# Patient Record
Sex: Female | Born: 1935 | Race: White | Hispanic: No | Smoking: Never smoker
Health system: Southern US, Community
[De-identification: ages and names within clinical notes are randomized; demographics above are authoritative.]

## PROBLEM LIST (undated history)

## (undated) DIAGNOSIS — E559 Vitamin D deficiency, unspecified: Secondary | ICD-10-CM

## (undated) DIAGNOSIS — I10 Essential (primary) hypertension: Secondary | ICD-10-CM

## (undated) DIAGNOSIS — E785 Hyperlipidemia, unspecified: Secondary | ICD-10-CM

## (undated) HISTORY — PX: OTHER SURGICAL HISTORY: SHX169

---

## 2017-03-08 ENCOUNTER — Ambulatory Visit: Payer: Medicare Other | Admitting: Physical Therapy

## 2017-03-12 ENCOUNTER — Encounter: Payer: Medicare Other | Admitting: Physical Therapy

## 2017-03-14 ENCOUNTER — Encounter: Payer: Medicare Other | Admitting: Physical Therapy

## 2017-03-19 ENCOUNTER — Ambulatory Visit: Payer: Medicare Other | Admitting: Physical Therapy

## 2017-03-21 ENCOUNTER — Encounter: Payer: Medicare Other | Admitting: Physical Therapy

## 2017-03-26 ENCOUNTER — Encounter: Payer: Medicare Other | Admitting: Physical Therapy

## 2017-03-28 ENCOUNTER — Encounter: Payer: Medicare Other | Admitting: Physical Therapy

## 2017-04-02 ENCOUNTER — Encounter: Payer: Medicare Other | Admitting: Physical Therapy

## 2018-03-03 ENCOUNTER — Inpatient Hospital Stay: Payer: Medicare Other

## 2018-03-03 ENCOUNTER — Other Ambulatory Visit: Payer: Self-pay

## 2018-03-03 ENCOUNTER — Encounter: Payer: Self-pay | Admitting: Internal Medicine

## 2018-03-03 ENCOUNTER — Emergency Department: Payer: Medicare Other

## 2018-03-03 ENCOUNTER — Inpatient Hospital Stay
Admission: EM | Admit: 2018-03-03 | Discharge: 2018-03-06 | DRG: 065 | Disposition: A | Discharge status: Skilled Nursing Facility | Payer: Medicare Other | Attending: Internal Medicine | Admitting: Internal Medicine

## 2018-03-03 DIAGNOSIS — Z515 Encounter for palliative care: Secondary | ICD-10-CM | POA: Diagnosis not present

## 2018-03-03 DIAGNOSIS — E875 Hyperkalemia: Secondary | ICD-10-CM | POA: Diagnosis not present

## 2018-03-03 DIAGNOSIS — G8194 Hemiplegia, unspecified affecting left nondominant side: Secondary | ICD-10-CM | POA: Diagnosis present

## 2018-03-03 DIAGNOSIS — E7800 Pure hypercholesterolemia, unspecified: Secondary | ICD-10-CM | POA: Diagnosis present

## 2018-03-03 DIAGNOSIS — R4781 Slurred speech: Secondary | ICD-10-CM | POA: Diagnosis present

## 2018-03-03 DIAGNOSIS — Y92009 Unspecified place in unspecified non-institutional (private) residence as the place of occurrence of the external cause: Secondary | ICD-10-CM

## 2018-03-03 DIAGNOSIS — I639 Cerebral infarction, unspecified: Principal | ICD-10-CM | POA: Diagnosis present

## 2018-03-03 DIAGNOSIS — R531 Weakness: Secondary | ICD-10-CM

## 2018-03-03 DIAGNOSIS — N390 Urinary tract infection, site not specified: Secondary | ICD-10-CM

## 2018-03-03 DIAGNOSIS — Z66 Do not resuscitate: Secondary | ICD-10-CM | POA: Diagnosis present

## 2018-03-03 DIAGNOSIS — W19XXXA Unspecified fall, initial encounter: Secondary | ICD-10-CM | POA: Diagnosis present

## 2018-03-03 DIAGNOSIS — E860 Dehydration: Secondary | ICD-10-CM | POA: Diagnosis present

## 2018-03-03 DIAGNOSIS — R1310 Dysphagia, unspecified: Secondary | ICD-10-CM | POA: Diagnosis present

## 2018-03-03 DIAGNOSIS — F419 Anxiety disorder, unspecified: Secondary | ICD-10-CM | POA: Diagnosis present

## 2018-03-03 DIAGNOSIS — E119 Type 2 diabetes mellitus without complications: Secondary | ICD-10-CM | POA: Diagnosis present

## 2018-03-03 DIAGNOSIS — E785 Hyperlipidemia, unspecified: Secondary | ICD-10-CM | POA: Diagnosis present

## 2018-03-03 DIAGNOSIS — R29713 NIHSS score 13: Secondary | ICD-10-CM | POA: Diagnosis present

## 2018-03-03 DIAGNOSIS — E559 Vitamin D deficiency, unspecified: Secondary | ICD-10-CM | POA: Diagnosis present

## 2018-03-03 DIAGNOSIS — Z7189 Other specified counseling: Secondary | ICD-10-CM | POA: Diagnosis not present

## 2018-03-03 DIAGNOSIS — E876 Hypokalemia: Secondary | ICD-10-CM | POA: Diagnosis present

## 2018-03-03 DIAGNOSIS — Z7982 Long term (current) use of aspirin: Secondary | ICD-10-CM | POA: Diagnosis not present

## 2018-03-03 DIAGNOSIS — I10 Essential (primary) hypertension: Secondary | ICD-10-CM | POA: Diagnosis present

## 2018-03-03 DIAGNOSIS — R262 Difficulty in walking, not elsewhere classified: Secondary | ICD-10-CM

## 2018-03-03 HISTORY — DX: Vitamin D deficiency, unspecified: E55.9

## 2018-03-03 HISTORY — DX: Essential (primary) hypertension: I10

## 2018-03-03 HISTORY — DX: Hyperlipidemia, unspecified: E78.5

## 2018-03-03 LAB — BASIC METABOLIC PANEL
ANION GAP: 17 — AB (ref 5–15)
BUN: 38 mg/dL — ABNORMAL HIGH (ref 8–23)
CALCIUM: 9.7 mg/dL (ref 8.9–10.3)
CO2: 27 mmol/L (ref 22–32)
CREATININE: 0.97 mg/dL (ref 0.44–1.00)
Chloride: 93 mmol/L — ABNORMAL LOW (ref 98–111)
GFR calc Af Amer: >60 mL/min (ref >60)
GFR calc non Af Amer: 53 mL/min — ABNORMAL LOW (ref >60)
GLUCOSE: 119 mg/dL — AB (ref 70–99)
Potassium: 2.3 mmol/L — CL (ref 3.5–5.1)
Sodium: 137 mmol/L (ref 135–145)

## 2018-03-03 LAB — CK: Total CK: 31 U/L — ABNORMAL LOW (ref 38–234)

## 2018-03-03 LAB — HEPATIC FUNCTION PANEL
ALBUMIN: 3 g/dL — AB (ref 3.5–5.0)
ALT: 22 U/L (ref 0–44)
AST: 22 U/L (ref 15–41)
Alkaline Phosphatase: 85 U/L (ref 38–126)
BILIRUBIN DIRECT: 0.2 mg/dL (ref 0.0–0.2)
BILIRUBIN TOTAL: 1.1 mg/dL (ref 0.3–1.2)
Indirect Bilirubin: 0.9 mg/dL (ref 0.3–0.9)
Total Protein: 6.9 g/dL (ref 6.5–8.1)

## 2018-03-03 LAB — URINALYSIS, COMPLETE (UACMP) WITH MICROSCOPIC
Bilirubin Urine: NEGATIVE
GLUCOSE, UA: NEGATIVE mg/dL
HGB URINE DIPSTICK: NEGATIVE
Ketones, ur: 20 mg/dL — AB
Nitrite: NEGATIVE
PH: 6 (ref 5.0–8.0)
Protein, ur: NEGATIVE mg/dL
SPECIFIC GRAVITY, URINE: 1.02 (ref 1.005–1.030)

## 2018-03-03 LAB — CBC
HCT: 33.5 % — ABNORMAL LOW (ref 35.0–47.0)
Hemoglobin: 11.6 g/dL — ABNORMAL LOW (ref 12.0–16.0)
MCH: 31.9 pg (ref 26.0–34.0)
MCHC: 34.6 g/dL (ref 32.0–36.0)
MCV: 92.3 fL (ref 80.0–100.0)
Platelets: 285 10*3/uL (ref 150–440)
RBC: 3.63 MIL/uL — AB (ref 3.80–5.20)
RDW: 14.7 % — ABNORMAL HIGH (ref 11.5–14.5)
WBC: 8.7 10*3/uL (ref 3.6–11.0)

## 2018-03-03 LAB — MAGNESIUM: Magnesium: 2.2 mg/dL (ref 1.7–2.4)

## 2018-03-03 LAB — LIPASE, BLOOD: Lipase: 57 U/L — ABNORMAL HIGH (ref 11–51)

## 2018-03-03 LAB — TROPONIN I: TROPONIN I: 0.04 ng/mL — AB (ref <0.03)

## 2018-03-03 MED ORDER — SODIUM CHLORIDE 0.9 % IV BOLUS
500.0000 mL | Freq: Once | INTRAVENOUS | Status: AC
  Administered 2018-03-03: 500 mL via INTRAVENOUS

## 2018-03-03 MED ORDER — POTASSIUM CHLORIDE 10 MEQ/100ML IV SOLN
10.0000 meq | Freq: Once | INTRAVENOUS | Status: AC
  Administered 2018-03-03: 10 meq via INTRAVENOUS
  Filled 2018-03-03: qty 100

## 2018-03-03 MED ORDER — POTASSIUM CHLORIDE IN NACL 20-0.9 MEQ/L-% IV SOLN
INTRAVENOUS | Status: DC
  Administered 2018-03-03 – 2018-03-04 (×2): via INTRAVENOUS
  Filled 2018-03-03 (×6): qty 1000

## 2018-03-03 MED ORDER — SODIUM CHLORIDE 0.9 % IV SOLN
1.0000 g | Freq: Once | INTRAVENOUS | Status: AC
  Administered 2018-03-03: 1 g via INTRAVENOUS
  Filled 2018-03-03: qty 10

## 2018-03-03 MED ORDER — ATORVASTATIN CALCIUM 10 MG PO TABS
10.0000 mg | ORAL_TABLET | Freq: Every evening | ORAL | Status: DC
  Administered 2018-03-03: 10 mg via ORAL
  Filled 2018-03-03: qty 1

## 2018-03-03 MED ORDER — ONDANSETRON HCL 4 MG/2ML IJ SOLN: 4.0000 mg | Freq: Four times a day (QID) | INTRAMUSCULAR | Status: DC | PRN

## 2018-03-03 MED ORDER — AMLODIPINE BESYLATE 5 MG PO TABS
10.0000 mg | ORAL_TABLET | Freq: Every day | ORAL | Status: DC
Start: 2018-03-03 — End: 2018-03-04
  Administered 2018-03-03: 10 mg via ORAL
  Filled 2018-03-03 (×2): qty 2

## 2018-03-03 MED ORDER — SODIUM CHLORIDE 0.9 % IV SOLN
1.0000 g | INTRAVENOUS | Status: DC
  Administered 2018-03-04: 1 g via INTRAVENOUS
  Filled 2018-03-03: qty 1

## 2018-03-03 MED ORDER — PREGABALIN 50 MG PO CAPS
50.0000 mg | ORAL_CAPSULE | Freq: Three times a day (TID) | ORAL | Status: DC
  Administered 2018-03-03 – 2018-03-06 (×7): 50 mg via ORAL
  Filled 2018-03-03 (×8): qty 1

## 2018-03-03 MED ORDER — POTASSIUM CHLORIDE CRYS ER 20 MEQ PO TBCR
40.0000 meq | EXTENDED_RELEASE_TABLET | Freq: Once | ORAL | Status: AC
  Administered 2018-03-03: 40 meq via ORAL
  Filled 2018-03-03: qty 2

## 2018-03-03 MED ORDER — METOPROLOL TARTRATE 50 MG PO TABS
100.0000 mg | ORAL_TABLET | Freq: Two times a day (BID) | ORAL | Status: DC
Start: 2018-03-03 — End: 2018-03-04
  Filled 2018-03-03: qty 2

## 2018-03-03 MED ORDER — ACETAMINOPHEN 325 MG PO TABS
650.0000 mg | ORAL_TABLET | Freq: Four times a day (QID) | ORAL | Status: DC | PRN
  Administered 2018-03-03: 650 mg via ORAL
  Filled 2018-03-03: qty 2

## 2018-03-03 MED ORDER — CHOLECALCIFEROL 10 MCG (400 UNIT) PO TABS
400.0000 [IU] | ORAL_TABLET | Freq: Two times a day (BID) | ORAL | Status: DC
Start: 2018-03-03 — End: 2018-03-05
  Administered 2018-03-03 – 2018-03-05 (×4): 400 [IU] via ORAL
  Filled 2018-03-03 (×5): qty 1

## 2018-03-03 MED ORDER — HYDROCODONE-ACETAMINOPHEN 5-325 MG PO TABS
1.0000 | ORAL_TABLET | ORAL | Status: DC | PRN
  Administered 2018-03-04: 1 via ORAL
  Administered 2018-03-05 – 2018-03-06 (×3): 2 via ORAL
  Filled 2018-03-03: qty 2
  Filled 2018-03-03 (×2): qty 1
  Filled 2018-03-03: qty 2
  Filled 2018-03-03: qty 1

## 2018-03-03 MED ORDER — CAPTOPRIL 25 MG PO TABS
25.0000 mg | ORAL_TABLET | Freq: Two times a day (BID) | ORAL | Status: DC
  Administered 2018-03-03 – 2018-03-06 (×6): 25 mg via ORAL
  Filled 2018-03-03 (×7): qty 1

## 2018-03-03 MED ORDER — ACETAMINOPHEN 650 MG RE SUPP: 650.0000 mg | Freq: Four times a day (QID) | RECTAL | Status: DC | PRN

## 2018-03-03 MED ORDER — ASPIRIN EC 81 MG PO TBEC
81.0000 mg | DELAYED_RELEASE_TABLET | Freq: Every day | ORAL | Status: DC
  Administered 2018-03-04: 81 mg via ORAL
  Filled 2018-03-03 (×2): qty 1

## 2018-03-03 MED ORDER — SENNOSIDES-DOCUSATE SODIUM 8.6-50 MG PO TABS: 1.0000 | ORAL_TABLET | Freq: Every evening | ORAL | Status: DC | PRN

## 2018-03-03 MED ORDER — ONDANSETRON HCL 4 MG PO TABS: 4.0000 mg | ORAL_TABLET | Freq: Four times a day (QID) | ORAL | Status: DC | PRN

## 2018-03-03 MED ORDER — ENOXAPARIN SODIUM 40 MG/0.4ML ~~LOC~~ SOLN
40.0000 mg | SUBCUTANEOUS | Status: DC
  Administered 2018-03-03 – 2018-03-04 (×2): 40 mg via SUBCUTANEOUS
  Filled 2018-03-03 (×2): qty 0.4

## 2018-03-03 NOTE — H&P (Signed)
[facility] - [place] at [facility]   PATIENT NAME: [name]    MR#:  [id number]  DATE OF BIRTH:  April 09, 1933  DATE OF ADMISSION:  03/03/2018  PRIMARY CARE PHYSICIAN: Katrinka Blazing, MD   REQUESTING/REFERRING PHYSICIAN:   CHIEF COMPLAINT:   Chief Complaint  Patient presents with  . Weakness    HISTORY OF PRESENT ILLNESS: [name]  is a 84 y.o. female with a known history of hypertension, hyperlipidemia, vitamin D deficiency presented to the emergency room with generalized weakness.  Patient unable to ambulate for the last 2 days because of weakness.  According to the family member patient usually moves around at home with the help of a walker.  She has not been eating well and drinking enough fluids for the last couple of days.  Patient appears dry and dehydrated.  Work-up in the emergency room showed low potassium level and the patient was supplemented orally as well as intravenously.  She also has some dysuria.  No complaints of any chest pain, shortness of breath.  No history of any falls, head injury.  PAST MEDICAL HISTORY:   Past Medical History:  Diagnosis Date  . Hyperlipidemia   . Hypertension   . Vitamin D deficiency     PAST SURGICAL HISTORY: None  SOCIAL HISTORY:  Social History   Tobacco Use  . Smoking status: Never Smoker  . Smokeless tobacco: Never Used  Substance Use Topics  . Alcohol use: Never    Frequency: Never    FAMILY HISTORY: Mother and father deceased Reviewed and non contributory  DRUG ALLERGIES: No Known Allergies  REVIEW OF SYSTEMS:   CONSTITUTIONAL: No fever, has fatigue and weakness.  EYES: No blurred or double vision.  EARS, NOSE, AND THROAT: No tinnitus or ear pain.  RESPIRATORY: No cough, shortness of breath, wheezing or hemoptysis.  CARDIOVASCULAR: No chest pain, orthopnea, edema.  GASTROINTESTINAL: No nausea, vomiting, diarrhea or abdominal pain.  GENITOURINARY: No dysuria, hematuria.   ENDOCRINE: No polyuria, nocturia,  HEMATOLOGY: No anemia, easy bruising or bleeding SKIN: No rash or lesion. MUSCULOSKELETAL: No joint pain or arthritis.   Has low back pain. NEUROLOGIC: No tingling, numbness, weakness.  PSYCHIATRY: No anxiety or depression.   MEDICATIONS AT HOME:  Prior to Admission medications   Medication Sig Start Date End Date Taking? Authorizing Provider  amLODipine (NORVASC) 10 MG tablet Take 1 tablet by mouth daily. 05/03/17  Yes [provider]  aspirin (ASPIR-LOW) 81 MG EC tablet Take 1 tablet by mouth daily. 09/15/10  Yes [provider]  atorvastatin (LIPITOR) 10 MG tablet Take 1 tablet by mouth daily. 05/03/17  Yes [provider]  captopril (CAPOTEN) 25 MG tablet Take 1 tablet by mouth 2 (two) times daily. 05/03/17  Yes [provider]  chlorthalidone (HYGROTON) 25 MG tablet Take 1 tablet by mouth daily. 11/05/17  Yes [provider]  Cholecalciferol (VITAMIN D3) 400 units CAPS Take 1 tablet by mouth 2 (two) times daily. 02/01/12  Yes [provider]  metoprolol tartrate (LOPRESSOR) 100 MG tablet Take 1 tablet by mouth 2 (two) times daily. 05/03/17  Yes [provider]  pregabalin (LYRICA) 50 MG capsule Take 1 capsule by mouth 3 (three) times daily. 04/17/17  Yes [provider]      PHYSICAL EXAMINATION:   VITAL SIGNS: Blood pressure 133/85, pulse 72, temperature 97.7 F (36.5 C), temperature source Oral, resp. rate 14, height 5\' 1"  (1.549 m), weight 63.5 kg (140 lb), SpO2  100 %.  GENERAL:  84 y.o.-year-old patient lying in the bed with no acute distress.  EYES: Pupils equal, round, reactive to light and accommodation. No scleral icterus. Extraocular muscles intact.  HEENT: Head atraumatic, normocephalic. Oropharynx dry and nasopharynx clear.  NECK:  Supple, no jugular venous distention. No thyroid enlargement, no tenderness.  LUNGS: Normal breath sounds bilaterally, no wheezing,  rales,rhonchi or crepitation. No use of accessory muscles of respiration.  CARDIOVASCULAR: S1, S2 normal. No murmurs, rubs, or gallops.  ABDOMEN: Soft, nontender, nondistended. Bowel sounds present. No organomegaly or mass.  EXTREMITIES: No pedal edema, cyanosis, or clubbing.  NEUROLOGIC: Cranial nerves II through XII are intact. Muscle strength 5/5 in all extremities. Sensation intact. Gait not checked.  PSYCHIATRIC: The patient is alert and oriented x 3.  SKIN: No obvious rash, lesion, or ulcer.   LABORATORY PANEL:   CBC Recent Labs  Lab 03/03/18 1356  WBC 8.7  HGB 11.6*  HCT 33.5*  PLT 285  MCV 92.3  MCH 31.9  MCHC 34.6  RDW 14.7*   ------------------------------------------------------------------------------------------------------------------  Chemistries  Recent Labs  Lab 03/03/18 1356  NA 137  K 2.3*  CL 93*  CO2 27  GLUCOSE 119*  BUN 38*  CREATININE 0.97  CALCIUM 9.7  MG 2.2  AST 22  ALT 22  ALKPHOS 85  BILITOT 1.1   ------------------------------------------------------------------------------------------------------------------ estimated creatinine clearance is 37.5 mL/min (by C-G formula based on SCr of 0.97 mg/dL). ------------------------------------------------------------------------------------------------------------------ No results for input(s): TSH, T4TOTAL, T3FREE, THYROIDAB in the last 72 hours.  Invalid input(s): FREET3   Coagulation profile No results for input(s): INR, PROTIME in the last 168 hours. ------------------------------------------------------------------------------------------------------------------- No results for input(s): DDIMER in the last 72 hours. -------------------------------------------------------------------------------------------------------------------  Cardiac Enzymes Recent Labs  Lab 03/03/18 1356  TROPONINI 0.04*    ------------------------------------------------------------------------------------------------------------------ Invalid input(s): POCBNP  ---------------------------------------------------------------------------------------------------------------  Urinalysis    Component Value Date/Time   COLORURINE YELLOW (A) 03/03/2018 1417   APPEARANCEUR HAZY (A) 03/03/2018 1417   LABSPEC 1.020 03/03/2018 1417   PHURINE 6.0 03/03/2018 1417   GLUCOSEU NEGATIVE 03/03/2018 1417   HGBUR NEGATIVE 03/03/2018 1417   BILIRUBINUR NEGATIVE 03/03/2018 1417   KETONESUR 20 (A) 03/03/2018 1417   PROTEINUR NEGATIVE 03/03/2018 1417   NITRITE NEGATIVE 03/03/2018 1417   LEUKOCYTESUR SMALL (A) 03/03/2018 1417     RADIOLOGY: Dg Chest 2 View  Result Date: 03/03/2018 CLINICAL DATA:  84 y/o F; generalized weakness and decreased appetite. EXAM: CHEST - 2 VIEW COMPARISON:  None. FINDINGS: Mild cardiomegaly. Aortic atherosclerosis with calcification. Clear lungs. No pleural effusion or pneumothorax. No acute osseous abnormality is evident. L2 compression deformity, age indeterminate. IMPRESSION: 1. No acute pulmonary process identified. 2. Aortic atherosclerosis and mild cardiomegaly. 3. Age indeterminate L2 compression deformity. Electronically Signed   By: Mitzi Hansen M.D.   On: 03/03/2018 15:17    EKG: Orders placed or performed during the hospital encounter of 03/03/18  . ED EKG  . ED EKG  . EKG 12-Lead  . EKG 12-Lead    IMPRESSION AND PLAN:  84 year old elderly female patient with history of hyperlipidemia, hypertension, vitamin D deficiency presented to the emergency room for weakness  -Acute hypokalemia Replace potassium intravenously Admit patient to telemetry inpatient service  -Dehydration IV fluid hydration  -Ambulatory dysfunction Physical therapy evaluation  -Hypertension  resume home medications for control of blood pressure  - DVT prophylaxis with Prosser lovenox  daily  All the records are reviewed and case discussed with ED provider. Management plans discussed with the patient, family  and they are in agreement.  CODE STATUS:DNR    Code Status Orders  (From admission, onward)        Start     Ordered   03/03/18 1605  Do not attempt resuscitation (DNR)  Continuous    Question Answer Comment  In the event of cardiac or respiratory ARREST Do not call a "code blue"   In the event of cardiac or respiratory ARREST Do not perform Intubation, CPR, defibrillation or ACLS   In the event of cardiac or respiratory ARREST Use medication by any route, position, wound care, and other measures to relive pain and suffering. May use oxygen, suction and manual treatment of airway obstruction as needed for comfort.      03/03/18 1604    Code Status History    This patient has a current code status but no historical code status.       TOTAL TIME TAKING CARE OF THIS PATIENT: 54 minutes.    Ihor AustinPavan Pyreddy M.D on 03/03/2018 at 4:09 PM  Between 7am to 6pm - Pager - [phone]  After 6pm go to [email or web address] - password [email or web address] [facility]  [facility]  Office  [phone]  CC: Primary care physician; Katrinka BlazingHalpert, Karen D, MD

## 2018-03-03 NOTE — ED Notes (Signed)
ED Provider at bedside. 

## 2018-03-03 NOTE — ED Triage Notes (Signed)
Pt arrives via ems from home, pt was assisted a few days ago by ems for a fall. Pt has had increased weakness over the past few days, decreased appetite,decreased po intake, pt reports weak and just wants to sleep, pt's speech is slow and mucous membranes are dry pt has an upcoming appt in the am for rehab eval. fsbs for ems 120

## 2018-03-03 NOTE — Progress Notes (Signed)
Advanced care plan. Purpose of the Encounter: CODE STATUS Parties in Attendance:Patient and family Patient's Decision Capacity:Good Subjective/Patient's story: Presented to ER for difficulty in ambulation and weakness Objective/Medical story Has hypokalemia and dehydration Goals of care determination:  Advance care directives and goals of care discussed Patient and family do not want any cardiac resuscitation, intubation and ventilator if need arises CODE STATUS: DNR Time spent discussing advanced care planning: 16 minutes

## 2018-03-03 NOTE — Progress Notes (Signed)
Notified MD of blood pressure, orders placed. Will continue to monitor and assess.

## 2018-03-03 NOTE — ED Notes (Addendum)
Genelle BalBrett, EDT, to transport pt to 2A[address]248. Floor aware.

## 2018-03-03 NOTE — Plan of Care (Signed)
  Problem: Clinical Measurements: Goal: Diagnostic test results will improve Outcome: Progressing Goal: Cardiovascular complication will be avoided Outcome: Progressing   Problem: Cardiac: Goal: Ability to achieve and maintain adequate cardiopulmonary perfusion will improve Outcome: Progressing   

## 2018-03-03 NOTE — ED Provider Notes (Signed)
[facility] Emergency Department Provider Note ____________________________________________   First MD Initiated Contact with Patient 03/03/18 1352     (approximate)  I have reviewed the triage vital signs and the nursing notes.  HISTORY  Chief Complaint Weakness  EM caveat: Some limitation due to confusion and weakness, the patient's daughter at the bedside also provides helps fill in gaps and history  HPI [name] is a 84 y.o. female previous history of hypertension high cholesterol diabetes, hip bursitis  Patient presents today, since the last 3 or 4 days the patient has had increased weakness.  Daughter reports some confusion at times, not wanting to get up has not been able to get up out of bed for about the last 2 days.  She had a fall a few days ago and has a progressive increase in weakness.  No chest pain or trouble breathing.  Reports she has pain in both hips, but this is been long-standing.  No other pain.  Just feels very tired and weak.  She has not been able to do anything other than drink a little bit of water and eat some occasional ice cream the last couple days according to daughter.  Denies abdominal pain.  No fevers or chills.  She denies speech changes, no weakness in one arm or leg.  No new numbness.  She reports her voice feels thick though because her mouth is extremely dry.    Of note, the patient does present with a DNR form.  Both she and her daughter affirmed that she would not wish to have any CPR or heroic measures performed.  However, she is amenable to receiving IV fluids and antibiotics if infection is found.  They do not wish for her to have any type of CAT scan at this time based on her goals of care, but only if we could not come up with a reasonable solution for her weakness which she was to have a CT scan today.   No past medical history on file.  Medications include Norvasc, aspirin, Lyrica, metoprolol,  No known  drug allergies  There are no active problems to display for this patient.   [facility] care [facility].  Prior to Admission medications   Not on File    Allergies Patient has no known allergies.  No family history on file.  Social History Social History   Tobacco Use  . Smoking status: Not on file  Substance Use Topics  . Alcohol use: Not on file  . Drug use: Not on file  Does not smoke drink or use illicit drugs  Review of Systems Constitutional: No fever/chills feels generally very fatigued Eyes: No visual changes. ENT: No sore throat.  Mouth feels very dry Cardiovascular: Denies chest pain. Respiratory: Denies shortness of breath. Gastrointestinal: No abdominal pain.  No nausea, no vomiting.  No diarrhea.  No constipation. Genitourinary: Negative for dysuria. Musculoskeletal: Negative for back pain.  Pain in both hips, chronic.  Did have a slip out of bed a few days ago, EMS had to assist her back into bed which she had no injuries.  Did not strike her head.  Did not lose consciousness. Skin: Negative for rash. Neurological: Negative for headaches, focal weakness or numbness.    ____________________________________________   PHYSICAL EXAM:  VITAL SIGNS: ED Triage Vitals  Enc Vitals Group     BP 03/03/18 1334 (!) 101/55     Pulse Rate 03/03/18 1334 79  Resp 03/03/18 1334 18     Temp 03/03/18 1334 97.7 F (36.5 C)     Temp Source 03/03/18 1334 Oral     SpO2 03/03/18 1334 100 %     Weight 03/03/18 1335 140 lb (63.5 kg)     Height 03/03/18 1335 5\' 1"  (1.549 m)     Head Circumference --      Peak Flow --      Pain Score 03/03/18 1335 6     Pain Loc --      Pain Edu? --      Excl. in GC? --     Constitutional: Alert and oriented.  Very fatigued.  Oriented to situation and place, but not well oriented today. Eyes: Conjunctivae are normal. Head: Atraumatic. Nose: No congestion/rhinnorhea. Mouth/Throat: Mucous membranes are quite  dry.  Neck: No stridor.   Cardiovascular: Normal rate, regular rhythm. Grossly normal heart sounds.  Good peripheral circulation. Respiratory: Normal respiratory effort.  No retractions. Lungs CTAB. Gastrointestinal: Soft and nontender. No distention. Musculoskeletal: No lower extremity tenderness nor edema.  Able to range the lower extremities well without any pain elicited in either hip or any of the major joints.  Patient rolled on her side, some very slight erythema over the sacral region without skin breakdown is noted. Neurologic:  Normal speech and language. No gross focal neurologic deficits are appreciated.  She is able to use all extremities with 5 out of 5 strength, though slightly reduced in the lower extremities equally bilaterally to 4+.  Normal sensation across the face both arms and legs.  No facial droop.  Cranial nerves intact.  She speaks clearly, but somewhat muffled and reporting her mouth feels very thick and dry. Skin:  Skin is warm, dry and intact. No rash noted. Psychiatric: Mood and affect are flat to calm.  ____________________________________________   LABS (all labs ordered are listed, but only abnormal results are displayed)  Labs Reviewed  BASIC METABOLIC PANEL - Abnormal; Notable for the following components:      Result Value   Potassium 2.3 (*)    Chloride 93 (*)    Glucose, Bld 119 (*)    BUN 38 (*)    GFR calc non Af Amer 53 (*)    Anion gap 17 (*)    All other components within normal limits  CBC - Abnormal; Notable for the following components:   RBC 3.63 (*)    Hemoglobin 11.6 (*)    HCT 33.5 (*)    RDW 14.7 (*)    All other components within normal limits  URINALYSIS, COMPLETE (UACMP) WITH MICROSCOPIC - Abnormal; Notable for the following components:   Color, Urine YELLOW (*)    APPearance HAZY (*)    Ketones, ur 20 (*)    Leukocytes, UA SMALL (*)    Bacteria, UA RARE (*)    All other components within normal limits  URINE CULTURE  HEPATIC  FUNCTION PANEL  LIPASE, BLOOD  TROPONIN I  CK  MAGNESIUM   ____________________________________________  EKG  Reviewed enterotomy at 1345 Heart rate 80 QRS 100 QTc 470 Normal sinus rhythm, nonspecific T wave abnormality seen in multiple leads.  No evidence of acute ischemia denoted. ____________________________________________  RADIOLOGY    Chest x-ray reviewed negative for acute.  Mild cardiomegaly.   ____________________________________________   PROCEDURES  Procedure(s) performed: None  Procedures  Critical Care performed: No  ____________________________________________   INITIAL IMPRESSION / ASSESSMENT AND PLAN / ED COURSE  Pertinent labs & imaging  results that were available during my care of the patient were reviewed by me and considered in my medical decision making (see chart for details).  Patient presents for evaluation of increasing weakness.  No focal weakness, appears generalized in nature.  She has been progressing to the point that she can no longer ambulate independently and is lost her appetite.  She appears very fatigued, generally weak but no focal abnormality and no acute distress.  We will initiate a broad work-up, discussed obtaining a CT of the head to evaluate for possible "stroke" or bleeding or injury after fall, but in discussion with the patient and her daughter is very clear that the patient would not wish for any heroic treatment or major neurologic surgery and she asked that we not perform the CT of the head.  With shared medical decision making after discussing the reasoning for such study, I am agreeable with the patient and her family and we will not obtain a CT of the head at this time but could potentially consider performing said if she is not showing improvement with hydration, treatment for suspected UTI, and repletion of potassium.  ----------------------------------------- 3:28 PM on  03/03/2018 -----------------------------------------  Discussed with patient, also her daughter plan for admission.  Will replete potassium, check magnesium.  Also initiate Rocephin for elevated suspicion for UTI, urine culture pending.  Elevated anion gap to noted, appears likely due to some hypochloremia in this instance with a normal bicarbonate.  Discussed with hospitalist, Dr. Tobi Bastos.  Ongoing ER care including follow-up on remaining lab work assigned to Dr. Lenard Lance.      ____________________________________________   FINAL CLINICAL IMPRESSION(S) / ED DIAGNOSES  Final diagnoses:  Weakness  Generalized weakness  Hypokalemia  Urinary tract infection, acute  Unable to ambulate      NEW MEDICATIONS STARTED DURING THIS VISIT:  New Prescriptions   No medications on file     Note:  This document was prepared using Dragon voice recognition software and may include unintentional dictation errors.     Sharyn Creamer, MD 03/03/18 [phone]

## 2018-03-03 NOTE — ED Notes (Signed)
Admitting MD at bedside.

## 2018-03-04 ENCOUNTER — Inpatient Hospital Stay
Admit: 2018-03-04 | Discharge: 2018-03-04 | Disposition: A | Discharge status: Home / Self Care | Payer: Medicare Other | Attending: Internal Medicine | Admitting: Internal Medicine

## 2018-03-04 ENCOUNTER — Inpatient Hospital Stay: Payer: Medicare Other

## 2018-03-04 LAB — CBC
HCT: 28.3 % — ABNORMAL LOW (ref 35.0–47.0)
Hemoglobin: 9.8 g/dL — ABNORMAL LOW (ref 12.0–16.0)
MCH: 32 pg (ref 26.0–34.0)
MCHC: 34.5 g/dL (ref 32.0–36.0)
MCV: 92.8 fL (ref 80.0–100.0)
Platelets: 245 10*3/uL (ref 150–440)
RBC: 3.05 MIL/uL — ABNORMAL LOW (ref 3.80–5.20)
RDW: 14.9 % — ABNORMAL HIGH (ref 11.5–14.5)
WBC: 8.7 10*3/uL (ref 3.6–11.0)

## 2018-03-04 LAB — BASIC METABOLIC PANEL
Anion gap: 10 (ref 5–15)
BUN: 31 mg/dL — ABNORMAL HIGH (ref 8–23)
CALCIUM: 8.9 mg/dL (ref 8.9–10.3)
CHLORIDE: 102 mmol/L (ref 98–111)
CO2: 26 mmol/L (ref 22–32)
CREATININE: 0.83 mg/dL (ref 0.44–1.00)
GFR calc Af Amer: >60 mL/min (ref >60)
GFR calc non Af Amer: >60 mL/min (ref >60)
GLUCOSE: 121 mg/dL — AB (ref 70–99)
Potassium: 2.9 mmol/L — ABNORMAL LOW (ref 3.5–5.1)
Sodium: 138 mmol/L (ref 135–145)

## 2018-03-04 LAB — URINE CULTURE
Culture: NO GROWTH
Special Requests: NORMAL

## 2018-03-04 MED ORDER — ATORVASTATIN CALCIUM 20 MG PO TABS
40.0000 mg | ORAL_TABLET | Freq: Every day | ORAL | Status: DC
  Administered 2018-03-04: 40 mg via ORAL
  Filled 2018-03-04: qty 2

## 2018-03-04 MED ORDER — STROKE: EARLY STAGES OF RECOVERY BOOK
Freq: Once | Status: AC
  Administered 2018-03-04: 15:00:00

## 2018-03-04 MED ORDER — ASPIRIN EC 325 MG PO TBEC
325.0000 mg | DELAYED_RELEASE_TABLET | Freq: Every day | ORAL | Status: DC
  Administered 2018-03-04 – 2018-03-06 (×3): 325 mg via ORAL
  Filled 2018-03-04 (×3): qty 1

## 2018-03-04 MED ORDER — POTASSIUM CHLORIDE CRYS ER 20 MEQ PO TBCR
40.0000 meq | EXTENDED_RELEASE_TABLET | Freq: Two times a day (BID) | ORAL | Status: AC
  Administered 2018-03-04 (×2): 40 meq via ORAL
  Filled 2018-03-04 (×2): qty 2

## 2018-03-04 NOTE — Progress Notes (Signed)
OT Cancellation Note  Patient Details Name: [name] MRN: [id number] DOB: Nov 08, 1932   Cancelled Treatment:    Reason Eval/Treat Not Completed: Patient at procedure or test/ unavailable. Order received, chart reviewed. Pt out of the room for diagnostic testing. Will re-attempt at later date/time as pt is available and medically appropriate.   Richrd PrimeJamie Stiller, MPH, MS, OTR/L ascom [phone] 03/04/18, 2:46 PM

## 2018-03-04 NOTE — Plan of Care (Signed)
  Problem: Education: Goal: Knowledge of General Education information will improve Outcome: Progressing   Problem: Health Behavior/Discharge Planning: Goal: Ability to manage health-related needs will improve Outcome: Progressing   Problem: Clinical Measurements: Goal: Ability to maintain clinical measurements within normal limits will improve Outcome: Progressing Goal: Will remain free from infection Outcome: Progressing Goal: Diagnostic test results will improve Outcome: Progressing Goal: Respiratory complications will improve Outcome: Progressing Goal: Cardiovascular complication will be avoided Outcome: Progressing   Problem: Activity: Goal: Risk for activity intolerance will decrease Outcome: Progressing   Problem: Nutrition: Goal: Adequate nutrition will be maintained Outcome: Progressing   Problem: Coping: Goal: Level of anxiety will decrease Outcome: Progressing   Problem: Elimination: Goal: Will not experience complications related to bowel motility Outcome: Progressing Goal: Will not experience complications related to urinary retention Outcome: Progressing   Problem: Pain Managment: Goal: General experience of comfort will improve Outcome: Progressing   Problem: Safety: Goal: Ability to remain free from injury will improve Outcome: Progressing   Problem: Skin Integrity: Goal: Risk for impaired skin integrity will decrease Outcome: Progressing   Problem: Cardiac: Goal: Ability to achieve and maintain adequate cardiopulmonary perfusion will improve Outcome: Progressing   Problem: Education: Goal: Knowledge of disease or condition will improve Outcome: Progressing Goal: Knowledge of secondary prevention will improve Outcome: Progressing Goal: Knowledge of patient specific risk factors addressed and post discharge goals established will improve Outcome: Progressing   Problem: Coping: Goal: Will verbalize positive feelings about self Outcome:  Progressing Goal: Will identify appropriate support needs Outcome: Progressing   Problem: Self-Care: Goal: Ability to participate in self-care as condition permits will improve Outcome: Progressing Goal: Verbalization of feelings and concerns over difficulty with self-care will improve Outcome: Progressing Goal: Ability to communicate needs accurately will improve Outcome: Progressing   Problem: Nutrition: Goal: Risk of aspiration will decrease Outcome: Progressing   Problem: Ischemic Stroke/TIA Tissue Perfusion: Goal: Complications of ischemic stroke/TIA will be minimized Outcome: Progressing

## 2018-03-04 NOTE — Progress Notes (Addendum)
[employer] - [place] at [facility]   PATIENT NAME: [name]    MR#:  [id number]  DATE OF BIRTH:  [birth year]  SUBJECTIVE:  CHIEF COMPLAINT:   Chief Complaint  Patient presents with  . Weakness   Mild slurred speech and the generalized weakness.  Fall at home. REVIEW OF SYSTEMS:  Review of Systems  Constitutional: Positive for malaise/fatigue. Negative for chills and fever.  HENT: Negative for sore throat.   Eyes: Negative for blurred vision and double vision.  Respiratory: Negative for cough, hemoptysis, shortness of breath, wheezing and stridor.   Cardiovascular: Negative for chest pain, palpitations, orthopnea and leg swelling.  Gastrointestinal: Positive for nausea. Negative for abdominal pain, blood in stool, diarrhea, melena and vomiting.  Genitourinary: Negative for dysuria, flank pain and hematuria.  Musculoskeletal: Positive for falls. Negative for back pain and joint pain.  Skin: Negative for rash.  Neurological: Positive for speech change and weakness. Negative for dizziness, tingling, tremors, sensory change, focal weakness, seizures, loss of consciousness and headaches.  Endo/Heme/Allergies: Negative for polydipsia.  Psychiatric/Behavioral: Negative for depression. The patient is not nervous/anxious.     DRUG ALLERGIES:  No Known Allergies VITALS:  Blood pressure 119/77, pulse 76, temperature 97.7 F (36.5 C), temperature source Oral, resp. rate 20, height 5\' 1"  (1.549 m), weight 124 lb (56.2 kg), SpO2 99 %. PHYSICAL EXAMINATION:  Physical Exam  Constitutional: She is oriented to person, place, and time. She appears well-developed.  HENT:  Head: Normocephalic.  Eyes: Pupils are equal, round, and reactive to light. Conjunctivae and EOM are normal. No scleral icterus.  Neck: Normal range of motion. Neck supple. No JVD present. No tracheal deviation present.  Cardiovascular: Normal rate, regular rhythm and normal heart sounds. Exam reveals  no gallop.  No murmur heard. Pulmonary/Chest: Effort normal and breath sounds normal. No respiratory distress. She has no wheezes. She has no rales.  Abdominal: Soft. Bowel sounds are normal. She exhibits no distension. There is no tenderness. There is no rebound.  Musculoskeletal: Normal range of motion. She exhibits no edema or tenderness.  Neurological: She is alert and oriented to person, place, and time.  No facial droop but has dysarthria, left-sided strength 2-3/5.  Right side strength 4/5.  Skin: No rash noted. No erythema.   LABORATORY PANEL:  Female CBC Recent Labs  Lab 03/04/18 0452  WBC 8.7  HGB 9.8*  HCT 28.3*  PLT 245   ------------------------------------------------------------------------------------------------------------------ Chemistries  Recent Labs  Lab 03/03/18 1356 03/04/18 0452  NA 137 138  K 2.3* 2.9*  CL 93* 102  CO2 27 26  GLUCOSE 119* 121*  BUN 38* 31*  CREATININE 0.97 0.83  CALCIUM 9.7 8.9  MG 2.2  --   AST 22  --   ALT 22  --   ALKPHOS 85  --   BILITOT 1.1  --    RADIOLOGY:  Dg Chest 2 View  Result Date: 03/03/2018 CLINICAL DATA:  84 y/o F; generalized weakness and decreased appetite. EXAM: CHEST - 2 VIEW COMPARISON:  None. FINDINGS: Mild cardiomegaly. Aortic atherosclerosis with calcification. Clear lungs. No pleural effusion or pneumothorax. No acute osseous abnormality is evident. L2 compression deformity, age indeterminate. IMPRESSION: 1. No acute pulmonary process identified. 2. Aortic atherosclerosis and mild cardiomegaly. 3. Age indeterminate L2 compression deformity. Electronically Signed   By: Mitzi HansenLance  Furusawa-Stratton M.D.   On: 03/03/2018 15:17   Ct Head Wo Contrast  Result Date: 03/03/2018 CLINICAL DATA:  84 y/o F; 3-4 days of  increased weakness, dizziness, some confusion. EXAM: CT HEAD WITHOUT CONTRAST TECHNIQUE: Contiguous axial images were obtained from the base of the skull through the vertex without intravenous contrast.  COMPARISON:  None. FINDINGS: Brain: No evidence of acute infarction, hemorrhage, hydrocephalus, extra-axial collection or mass lesion/mass effect. Nonspecific foci of hypoattenuation in subcortical periventricular white matter are compatible with moderate chronic microvascular ischemic changes. Moderate diffuse brain parenchymal volume loss. Foci of hypoattenuation within bilateral thalami, lentiform nuclei, and the left caudate head are likely to represent chronic lacunar infarcts. Vascular: Calcific atherosclerosis of the carotid siphons and bilateral M1. No hyperdense vessel identified. Skull: Normal. Negative for fracture or focal lesion. Sinuses/Orbits: No acute finding. Other: Bilateral intra-ocular lens replacement. IMPRESSION: 1. No acute intracranial abnormality identified. 2. Moderate chronic microvascular ischemic changes and parenchymal volume loss of the brain. Several chronic lacunar infarcts in the basal ganglia. Electronically Signed   By: Mitzi Hansen M.D.   On: 03/03/2018 18:37   Mr Brain Wo Contrast  Result Date: 03/04/2018 CLINICAL DATA:  Generalized muscle weakness EXAM: MRI HEAD WITHOUT CONTRAST TECHNIQUE: Multiplanar, multiecho pulse sequences of the brain and surrounding structures were obtained without intravenous contrast. COMPARISON:  Head CT from yesterday FINDINGS: Brain: Right pontine restricted diffusion respecting the midline. Chronic small vessel ischemia in the cerebral white matter, overall mild for age. There are dilated perivascular spaces in the deep gray nuclei and upper brainstem. Mild for age cerebral volume loss that is generalized. No hemorrhage, hydrocephalus, or masslike finding. Vascular: Major flow voids are preserved Skull and upper cervical spine: No evidence of marrow lesion. Cervical facet spurring Sinuses/Orbits: Bilateral cataract resection.  Gaze to the left. Other: Progressively motion degraded study. Final sequence (coronal T2) is nondiagnostic.  IMPRESSION: Acute right pontine infarct. Electronically Signed   By: Marnee Spring M.D.   On: 03/04/2018 12:20   ASSESSMENT AND PLAN:   84 year old elderly female patient with history of hyperlipidemia, hypertension, vitamin D deficiency presented to the emergency room for weakness  -Acute CVA with left-sided weakness, unknown time window. ASA 325 stat, lipitor, neruo check, echocardiograph, carotid duplex and neurology consult.   Hypokalemia Replaced potassium intravenously Potassium is still low at 2.9.  Continue supplement and follow-up level.  -Dehydration Continue IV fluid hydration  -Hypertension  Hold HTN home medications due to low blood pressure.   PT evaluation suggest skilled nursing facility placement. All the records are reviewed and case discussed with Care Management/Social Worker. Management plans discussed with the patient, her daughter and they are in agreement.  CODE STATUS: DNR  TOTAL TIME TAKING CARE OF THIS PATIENT: 46 minutes.   More than 50% of the time was spent in counseling/coordination of care: YES  POSSIBLE D/C IN 2 DAYS, DEPENDING ON CLINICAL CONDITION.   Shaune Pollack M.D on 03/04/2018 at 2:08 PM  Between 7am to 6pm - Pager - [phone]  After 6pm go to [email or web address] - Therapist, nutritional Hospitalists

## 2018-03-04 NOTE — Care Management (Signed)
Patient presented from home with weakness, decreased po intake and fall. MRI positive for right pontine infarct.  Physical therapy evaluation performed with recommendation for SNF.  Patient did not ambulate during the initial evaluation due to weakness. CSW is aware of recommendation

## 2018-03-04 NOTE — Evaluation (Signed)
Physical Therapy Evaluation Patient Details Name: [name] MRN: [id number] DOB: Jul 22, 1933 Today's Date: 03/04/2018   History of Present Illness  Pt is an 84 y.o. female presenting to hospital 03/03/18 with increased weakness, confusion, recent fall (last Thursday).  Pt admitted with acute hypokalemia, dehydration, htn, and ambulatory dysfunction.  PMH includes L2 compression deformity, htn, DM, hip bursitis, chronic B hip pain.  Clinical Impression  Prior to hospital admission, pt was ambulatory beginning of June but has been having increasing weakness and eventually only able to transfer by self and daughter would push pt (pt sitting on seat of rollator) to bathroom as of 1 week ago; pt has been having even more difficulties with mobility for last week.  Pt lives with her daughter in 1 level home with ramp plus a few steps to enter home.  Currently pt is max assist with bed mobility and pt requiring R UE support on bedrail in order to maintain sitting balance (pt declined standing d/t weakness).  Upon PT assessment, pt with noted L UE and L LE weakness (compared to R side), L facial droop, and dysarthria.  Pt's daughter reporting pt has been having generalized weakness (with some changes in speech) but in last day has noted increased weakness on L side with increased speech changes.  D/t above noted symptoms/concerns, MD Imogene Burn notified immediately of above information; nurse also notified.  MRI of brain ordered.  Pt would benefit from skilled PT to address noted impairments and functional limitations (see below for any additional details).  Upon hospital discharge, currently recommend pt discharge to STR.    Follow Up Recommendations SNF    Equipment Recommendations  Rolling walker with 5" wheels    Recommendations for Other Services OT consult     Precautions / Restrictions Precautions Precautions: Fall Restrictions Weight Bearing Restrictions: No      Mobility  Bed Mobility Overal  bed mobility: Needs Assistance Bed Mobility: Supine to Sit;Sit to Supine     Supine to sit: Max assist;HOB elevated Sit to supine: Max assist;HOB elevated   General bed mobility comments: assist for trunk and L>R LE; vc's for technique; 2 assist to boost up in bed end of session  Transfers                 General transfer comment: pt declined d/t weakness  Ambulation/Gait             General Gait Details: pt declined d/t weakness  Stairs            Wheelchair Mobility    Modified Rankin (Stroke Patients Only)       Balance Overall balance assessment: Needs assistance Sitting-balance support: Bilateral upper extremity supported;Feet supported Sitting balance-Leahy Scale: Poor Sitting balance - Comments: pt requires B UE support for static sitting balance (pt holding onto bedrail with R UE to maintain sitting balance)                                     Pertinent Vitals/Pain Pain Assessment: Faces Faces Pain Scale: Hurts a little bit(2/10 at rest; 4/10 with movement) Pain Location: chronic B hip pain Pain Descriptors / Indicators: Grimacing Pain Intervention(s): Limited activity within patient's tolerance;Monitored during session;Repositioned    Home Living Family/patient expects to be discharged to:: Private residence Living Arrangements: Children(Pt's daughter) Available Help at Discharge: Family Type of Home: House Home Access: Ramped entrance;Stairs to enter Entrance Stairs-Rails:  None Entrance Stairs-Number of Steps: ramp plus 3 small steps Home Layout: One level Home Equipment: Walker - 4 wheels      Prior Function Level of Independence: Independent with assistive device(s)         Comments: Pt had been ambulatory but increasing weakness since last MD appt June 4th; progressive weakness where pt eventually unable to ambulate but could transfer to rollator seat on own and pt's daughter would push pt (with pt sitting on  rollator seat) to bathroom but pt has had increasing difficulty in last week with mobility     Hand Dominance        Extremity/Trunk Assessment   Upper Extremity Assessment Upper Extremity Assessment: RUE deficits/detail;LUE deficits/detail RUE Deficits / Details: fair R hand grip strength; 4/5 elbow flexion/extension; R shoulder AROM flexion to grossly 90 degrees LUE Deficits / Details: poor L hand grip strength; 3+/5 elbow flexion; 3-/5 elbow extension; 2-/5 L shoulder flexion (PROM to grossly 90 degrees)    Lower Extremity Assessment Lower Extremity Assessment: RLE deficits/detail;LLE deficits/detail RLE Deficits / Details: hip flexion at least 3+/5; knee flexion/extension at least 3+/5; DF at least 3+/5 LLE Deficits / Details: hip flexion 2-/5; knee flexion/extension 2/5; DF 2+/5    Cervical / Trunk Assessment Cervical / Trunk Assessment: Kyphotic  Communication   Communication: Other (comment)(slurred speech noted)  Cognition Arousal/Alertness: Awake/alert Behavior During Therapy: WFL for tasks assessed/performed Overall Cognitive Status: (Oriented to person, place, situation)                                        General Comments General comments (skin integrity, edema, etc.): Pt resting in bed upon PT arrival; pt's daughter present feeding pt breakfast (pt's daughter reports she has never had to do that for her mother before).  Nursing cleared pt for participation in physical therapy.  Pt agreeable to limited PT session d/t weakness and concern for falling (pt's daughter agreeable to PT session but appearing tearful intermittently during session).  Pt's daughter followed PT out of room end of session appearing tearful but upon therapist attempting to talk/comfort her, pt's daughter reporting she did not want to talk to/with therapist and had no concerns for therapist.    Exercises     Assessment/Plan    PT Assessment Patient needs continued PT services   PT Problem List Decreased strength;Decreased activity tolerance;Decreased balance;Decreased mobility;Decreased knowledge of use of DME;Decreased knowledge of precautions       PT Treatment Interventions DME instruction;Gait training;Stair training;Functional mobility training;Therapeutic activities;Therapeutic exercise;Balance training;Patient/family education    PT Goals (Current goals can be found in the Care Plan section)  Acute Rehab PT Goals Patient Stated Goal: to get stronger PT Goal Formulation: With patient/family Time For Goal Achievement: 03/18/18 Potential to Achieve Goals: Fair    Frequency Min 2X/week   Barriers to discharge Decreased caregiver support Level of assist required    Co-evaluation               AM-PAC PT "6 Clicks" Daily Activity  Outcome Measure Difficulty turning over in bed (including adjusting bedclothes, sheets and blankets)?: Unable Difficulty moving from lying on back to sitting on the side of the bed? : Unable Difficulty sitting down on and standing up from a chair with arms (e.g., wheelchair, bedside commode, etc,.)?: Unable Help needed moving to and from a bed to chair (including a wheelchair)?: Total Help needed  walking in hospital room?: Total Help needed climbing 3-5 steps with a railing? : Total 6 Click Score: 6    End of Session   Activity Tolerance: Patient limited by fatigue Patient left: in bed;with call bell/phone within reach;with bed alarm set;with family/visitor present Nurse Communication: Mobility status;Precautions PT Visit Diagnosis: Other abnormalities of gait and mobility (R26.89);Muscle weakness (generalized) (M62.81);History of falling (Z91.81);Difficulty in walking, not elsewhere classified (R26.2);Hemiplegia and hemiparesis Hemiplegia - Right/Left: Left Hemiplegia - dominant/non-dominant: Non-dominant Hemiplegia - caused by: Unspecified    Time: 1610-9604 PT Time Calculation (min) (ACUTE ONLY): 35  min   Charges:   PT Evaluation $PT Eval Low Complexity: 1 Low PT Treatments $Therapeutic Activity: 8-22 mins   PT G CodesHendricks Limes, PT 03/04/18, 9:57 AM [phone]

## 2018-03-04 NOTE — Progress Notes (Signed)
Notified MD of pt not having any output this shift. Bladder scanned, >400. Orders placed. Will continue to monitor and assess.

## 2018-03-04 NOTE — Clinical Social Work Note (Signed)
Clinical Social Work Assessment  Patient Details  Name: Vanessa Vanessa MRN: [id number] Date of Birth: Apr 01, 1933  Date of referral:  03/04/18               Reason for consult:  Facility Placement                Permission sought to share information with:  Family Supports, Magazine features editoracility Contact Representative Permission granted to share information::  Yes, Verbal Permission Granted  Name::     Vanessa Vanessa,Vanessa Vanessa [phone]   Agency::  SNF admissions  Relationship::     Contact Information:     Housing/Transportation Living arrangements for the past 2 months:  Single Family Home Source of Information:  Patient, Medical Team Patient Interpreter Needed:  None Criminal Activity/Legal Involvement Pertinent to Current Situation/Hospitalization:  No - Comment as needed Significant Relationships:  Adult Children Lives with:  Adult Children Do you feel safe going back to the place where you live?  No Need for family participation in patient care:  Yes (Comment)  Care giving concerns:  Patient and family feels that she needs some short term rehab before she is able to return back home.   Social Worker assessment / plan:  Patient is an 84 year old female who lives with her Vanessa Vanessa, patient is alert and oriented x2.  Patient was hard to talk to due to some confusion, patient states she lives with her mother who is 82 years old, however patient is 84 years old.  Patient expressed that she states her legs aren't working, CSW offered to speak to patients' Vanessa, but per patient, Vanessa is working and unable to answer phone today.  CSW called patient's Vanessa and spoke to her about short term rehab.  CSW explained to patient's Vanessa process for looking for placement and how insurance will pay for stay.  Patient's Vanessa was informed that there is a list in patient's room of different facilities.  CSW was also informed that patient lives with her Vanessa and prior to this hospitalization,  patient was mostly independent with mobility.  Patient's Vanessa gave CSW permission to begin bed search in [facility], patient's Vanessa did not have any other questions at this time.    Employment status:  Retired Database administratornsurance information:  Managed Medicare PT Recommendations:  Skilled Nursing Facility Information / Referral to community resources:     Patient/Family's Response to care:  Patient and family are agreeable to having patient go to SNF for rehab.  Patient/Family's Understanding of and Emotional Response to Diagnosis, Current Treatment, and Prognosis:  Patient's Vanessa is concerned that patient may not improve enough to return home, CSW informed her that many patients go to SNF for rehab, and then they improve enough to return back home, but it all depends on how she does.  Emotional Assessment Appearance:  Appears stated age Attitude/Demeanor/Rapport:    Affect (typically observed):  Accepting, Stable, Appropriate Orientation:  Oriented to Self Alcohol / Substance use:  Not Applicable Psych involvement (Current and /or in the community):  No (Comment)  Discharge Needs  Concerns to be addressed:  Care Coordination, Lack of Support Readmission within the last 30 days:  No Current discharge risk:  Lack of support system Barriers to Discharge:  English as a second language teachernsurance Authorization, Continued Medical Work up   Vanessa Vanessa, Vanessa Vanessa, LCSWA 03/04/2018, 4:59 PM

## 2018-03-04 NOTE — Clinical Social Work Note (Signed)
CSW attempted to speak with patient to discuss SNF placement for short term rehab, patient said she was tired and wanted to rest.  CSW attempted to call patient's daughter [name] to discuss SNF placement left a message awaiting for call back.  Patient did give CSW permission to begin bed search in [facility].  Ervin KnackEric R. Demar Shad, MSW, Theresia MajorsLCSWA [phone]  03/04/2018 4:56 PM

## 2018-03-04 NOTE — [place] FL2 (Signed)
[place] MEDICAID FL2 LEVEL OF CARE SCREENING TOOL     IDENTIFICATION  Patient Name: Vanessa GurneyBeverly Reckart Birthdate: 21-Jul-1933 Sex: female Admission Date (Current Location): 03/03/2018  [place]   Facility and Address:  [facility], [address], [place], [place] [address]      Provider Number: [id number]  Attending Physician Name and Address:  Shaune Pollackhen, Qing, MD  Relative Name and Phone Number:  [name],Gail Daughter [phone]     Current Level of Care: Hospital Recommended Level of Care: Skilled Nursing Facility Prior Approval Number:    Date Approved/Denied:   PASRR Number: [id number]  Discharge Plan: SNF    Current Diagnoses: Patient Active Problem List   Diagnosis Date Noted  . Hypokalemia 03/03/2018    Orientation RESPIRATION BLADDER Height & Weight     Self, Time, Situation, Place  Normal Continent Weight: 124 lb (56.2 kg) Height:  5\' 1"  (154.9 cm)  BEHAVIORAL SYMPTOMS/MOOD NEUROLOGICAL BOWEL NUTRITION STATUS      Continent Diet(Regular diet)  AMBULATORY STATUS COMMUNICATION OF NEEDS Skin   Limited Assist Verbally Normal                       Personal Care Assistance Level of Assistance  Bathing, Dressing, Feeding Bathing Assistance: Limited assistance Feeding assistance: Independent Dressing Assistance: Limited assistance     Functional Limitations Info  Sight, Hearing, Speech Sight Info: Adequate Hearing Info: Adequate Speech Info: Adequate    SPECIAL CARE FACTORS FREQUENCY  PT (By licensed PT), OT (By licensed OT)     PT Frequency: 5x a week OT Frequency: 5x a week            Contractures Contractures Info: Not present    Additional Factors Info  Code Status, Allergies Code Status Info: DNR Allergies Info: NKA           Current Medications (03/04/2018):  This is the current hospital active medication list Current Facility-Administered Medications  Medication Dose Route  Frequency Provider Last Rate Last Dose  . 0.9 % NaCl with KCl 20 mEq/ L  infusion   Intravenous Continuous Ihor AustinPyreddy, Pavan, MD 75 mL/hr at 03/04/18 0951    . acetaminophen (TYLENOL) tablet 650 mg  650 mg Oral Q6H PRN Ihor AustinPyreddy, Pavan, MD   650 mg at 03/03/18 1905   Or  . acetaminophen (TYLENOL) suppository 650 mg  650 mg Rectal Q6H PRN Ihor AustinPyreddy, Pavan, MD      . aspirin EC tablet 325 mg  325 mg Oral Daily Shaune Pollackhen, Qing, MD   325 mg at 03/04/18 1335  . atorvastatin (LIPITOR) tablet 40 mg  40 mg Oral q1800 Shaune Pollackhen, Qing, MD      . captopril (CAPOTEN) tablet 25 mg  25 mg Oral BID Ihor AustinPyreddy, Pavan, MD   25 mg at 03/04/18 0929  . cholecalciferol (VITAMIN D) tablet 400 Units  400 Units Oral BID Ihor AustinPyreddy, Pavan, MD   400 Units at 03/04/18 0929  . enoxaparin (LOVENOX) injection 40 mg  40 mg Subcutaneous Q24H Ihor AustinPyreddy, Pavan, MD   40 mg at 03/03/18 2306  . HYDROcodone-acetaminophen (NORCO/VICODIN) 5-325 MG per tablet 1-2 tablet  1-2 tablet Oral Q4H PRN Pyreddy, Pavan, MD      . ondansetron (ZOFRAN) tablet 4 mg  4 mg Oral Q6H PRN Pyreddy, Pavan, MD       Or  . ondansetron (ZOFRAN) injection 4 mg  4 mg Intravenous Q6H PRN Ihor AustinPyreddy, Pavan, MD      .  potassium chloride SA (K-DUR,KLOR-CON) CR tablet 40 mEq  40 mEq Oral BID Shaune Pollack, MD   40 mEq at 03/04/18 0929  . pregabalin (LYRICA) capsule 50 mg  50 mg Oral TID Ihor Austin, MD   50 mg at 03/04/18 1550  . senna-docusate (Senokot-S) tablet 1 tablet  1 tablet Oral QHS PRN Ihor Austin, MD         Discharge Medications: Please see discharge summary for a list of discharge medications.  Relevant Imaging Results:  Relevant Lab Results:   Additional Information SSN [id number]  Darleene Cleaver, [place]

## 2018-03-04 NOTE — Progress Notes (Signed)
*  PRELIMINARY RESULTS* Echocardiogram 2D Echocardiogram has been performed.  [name] 03/04/2018, 9:01 PM

## 2018-03-05 DIAGNOSIS — R531 Weakness: Secondary | ICD-10-CM

## 2018-03-05 DIAGNOSIS — Z7189 Other specified counseling: Secondary | ICD-10-CM

## 2018-03-05 DIAGNOSIS — E876 Hypokalemia: Secondary | ICD-10-CM

## 2018-03-05 DIAGNOSIS — I639 Cerebral infarction, unspecified: Principal | ICD-10-CM

## 2018-03-05 DIAGNOSIS — Z515 Encounter for palliative care: Secondary | ICD-10-CM

## 2018-03-05 LAB — BASIC METABOLIC PANEL
Anion gap: 5 (ref 5–15)
BUN: 25 mg/dL — AB (ref 8–23)
CALCIUM: 8.5 mg/dL — AB (ref 8.9–10.3)
CO2: 22 mmol/L (ref 22–32)
Chloride: 112 mmol/L — ABNORMAL HIGH (ref 98–111)
Creatinine, Ser: 0.72 mg/dL (ref 0.44–1.00)
GFR calc Af Amer: >60 mL/min (ref >60)
GLUCOSE: 112 mg/dL — AB (ref 70–99)
Potassium: 6.2 mmol/L — ABNORMAL HIGH (ref 3.5–5.1)
Sodium: 139 mmol/L (ref 135–145)

## 2018-03-05 LAB — POTASSIUM
POTASSIUM: 6.5 mmol/L — AB (ref 3.5–5.1)
POTASSIUM: 6 mmol/L — AB (ref 3.5–5.1)
Potassium: 4.9 mmol/L (ref 3.5–5.1)

## 2018-03-05 LAB — LIPID PANEL
Cholesterol: 116 mg/dL (ref 0–200)
HDL: 38 mg/dL — ABNORMAL LOW (ref >40)
LDL Cholesterol: 54 mg/dL (ref 0–99)
Total CHOL/HDL Ratio: 3.1 RATIO
Triglycerides: 121 mg/dL (ref <150)
VLDL: 24 mg/dL (ref 0–40)

## 2018-03-05 LAB — ECHOCARDIOGRAM COMPLETE
HEIGHTINCHES: 61 in
Weight: 1984 oz

## 2018-03-05 LAB — HEMOGLOBIN A1C
Hgb A1c MFr Bld: 6.2 % — ABNORMAL HIGH (ref 4.8–5.6)
Mean Plasma Glucose: 131.24 mg/dL

## 2018-03-05 MED ORDER — GLYCOPYRROLATE 0.2 MG/ML IJ SOLN
0.2000 mg | INTRAMUSCULAR | Status: DC | PRN
  Filled 2018-03-05: qty 1

## 2018-03-05 MED ORDER — INSULIN ASPART 100 UNIT/ML IV SOLN
10.0000 [IU] | Freq: Once | INTRAVENOUS | Status: AC
  Administered 2018-03-05: 10 [IU] via INTRAVENOUS
  Filled 2018-03-05: qty 0.1

## 2018-03-05 MED ORDER — MORPHINE SULFATE (PF) 2 MG/ML IV SOLN: 1.0000 mg | INTRAVENOUS | Status: DC | PRN

## 2018-03-05 MED ORDER — ONDANSETRON HCL 4 MG/2ML IJ SOLN: 4.0000 mg | Freq: Four times a day (QID) | INTRAMUSCULAR | Status: DC | PRN

## 2018-03-05 MED ORDER — POLYVINYL ALCOHOL 1.4 % OP SOLN
1.0000 [drp] | Freq: Four times a day (QID) | OPHTHALMIC | Status: DC | PRN
  Filled 2018-03-05: qty 15

## 2018-03-05 MED ORDER — NEPRO/CARBSTEADY PO LIQD
237.0000 mL | Freq: Two times a day (BID) | ORAL | Status: DC
  Administered 2018-03-05: 237 mL via ORAL

## 2018-03-05 MED ORDER — BIOTENE DRY MOUTH MT LIQD: 15.0000 mL | OROMUCOSAL | Status: DC | PRN

## 2018-03-05 MED ORDER — GLYCOPYRROLATE 1 MG PO TABS
1.0000 mg | ORAL_TABLET | ORAL | Status: DC | PRN
  Filled 2018-03-05: qty 1

## 2018-03-05 MED ORDER — SODIUM CHLORIDE 0.9 % IV SOLN
1.0000 g | Freq: Once | INTRAVENOUS | Status: AC
  Administered 2018-03-05: 1 g via INTRAVENOUS
  Filled 2018-03-05: qty 10

## 2018-03-05 MED ORDER — DEXTROSE 50 % IV SOLN
25.0000 mL | Freq: Once | INTRAVENOUS | Status: AC
  Administered 2018-03-05: 25 mL via INTRAVENOUS
  Filled 2018-03-05: qty 50

## 2018-03-05 MED ORDER — GLYCOPYRROLATE 0.2 MG/ML IJ SOLN: 0.2000 mg | INTRAMUSCULAR | Status: DC | PRN

## 2018-03-05 MED ORDER — ONDANSETRON 4 MG PO TBDP
4.0000 mg | ORAL_TABLET | Freq: Four times a day (QID) | ORAL | Status: DC | PRN
  Filled 2018-03-05: qty 1

## 2018-03-05 NOTE — Progress Notes (Signed)
[facility]   PATIENT NAME: [name]    MR#:  [id number]  DATE OF BIRTH:  1933-08-17  SUBJECTIVE:  CHIEF COMPLAINT:   Chief Complaint  Patient presents with  . Weakness   still slurred speech and left side weakness.  The patient has dysphagia per speech study staff. REVIEW OF SYSTEMS:  Review of Systems  Constitutional: Positive for malaise/fatigue. Negative for chills and fever.  HENT: Negative for sore throat.   Eyes: Negative for blurred vision and double vision.  Respiratory: Negative for cough, hemoptysis, shortness of breath, wheezing and stridor.   Cardiovascular: Negative for chest pain, palpitations, orthopnea and leg swelling.  Gastrointestinal: Negative for abdominal pain, blood in stool, diarrhea, melena, nausea and vomiting.  Genitourinary: Negative for dysuria, flank pain and hematuria.  Musculoskeletal: Negative for back pain, falls and joint pain.  Skin: Negative for rash.  Neurological: Positive for speech change and focal weakness. Negative for dizziness, tingling, tremors, sensory change, seizures, loss of consciousness, weakness and headaches.  Endo/Heme/Allergies: Negative for polydipsia.  Psychiatric/Behavioral: Negative for depression. The patient is not nervous/anxious.     DRUG ALLERGIES:  No Known Allergies VITALS:  Blood pressure 128/63, pulse 65, temperature 97.8 F (36.6 C), temperature source Oral, resp. rate 17, height 5\' 1"  (1.549 m), weight 124 lb (56.2 kg), SpO2 90 %. PHYSICAL EXAMINATION:  Physical Exam  Constitutional: She is oriented to person, place, and time. She appears well-developed.  HENT:  Head: Normocephalic.  Eyes: Pupils are equal, round, and reactive to light. Conjunctivae and EOM are normal. No scleral icterus.  Neck: Normal range of motion. Neck supple. No JVD present. No tracheal deviation present.  Cardiovascular: Normal rate, regular rhythm and normal heart sounds. Exam  reveals no gallop.  No murmur heard. Pulmonary/Chest: Effort normal and breath sounds normal. No respiratory distress. She has no wheezes. She has no rales.  Abdominal: Soft. Bowel sounds are normal. She exhibits no distension. There is no tenderness. There is no rebound.  Musculoskeletal: Normal range of motion. She exhibits no edema or tenderness.  Neurological: She is alert and oriented to person, place, and time.  No facial droop but has dysarthria, left-sided strength arm 3/5 and leg 0/5.  Right side strength 4/5.  Skin: No rash noted. No erythema.   LABORATORY PANEL:  Female CBC Recent Labs  Lab 03/04/18 0452  WBC 8.7  HGB 9.8*  HCT 28.3*  PLT 245   ------------------------------------------------------------------------------------------------------------------ Chemistries  Recent Labs  Lab 03/03/18 1356  03/05/18 0653 03/05/18 1001  NA 137   < > 139  --   K 2.3*   < > 6.5*  6.2* 6.0*  CL 93*   < > 112*  --   CO2 27   < > 22  --   GLUCOSE 119*   < > 112*  --   BUN 38*   < > 25*  --   CREATININE 0.97   < > 0.72  --   CALCIUM 9.7   < > 8.5*  --   MG 2.2  --   --   --   AST 22  --   --   --   ALT 22  --   --   --   ALKPHOS 85  --   --   --   BILITOT 1.1  --   --   --    < > = values in this interval not displayed.   RADIOLOGY:  No results found. ASSESSMENT AND PLAN:   84 year old elderly female patient with history of hyperlipidemia, hypertension, vitamin D deficiency presented to the emergency room for weakness  -Acute CVA with left-sided weakness, unknown time window. Continue ASA 325, lipitor, neruo check, echocardiograph, carotid duplex: Less than 50% stenosis in the right and left internal carotid Arteries. Per Dr. Loretha BrasilZeylikman, head CT angiogram if the patient and daughter want further work-up.  Dysphagia.  Aspiration precaution.  Hypokalemia Replaced potassium intravenously, potassium is up to 6.2, repeat 6.0.  Hyperkalemia.  Give gluconate, D50 and  NovoLog.  Repeat potassium.  -Dehydration Improved with IV fluid support.  -Hypertension  Hold HTN home medications due to low blood pressure.   PT evaluation suggest skilled nursing facility placement.  Discussed with Dr. Loretha BrasilZeylikman. All the records are reviewed and case discussed with Care Management/Social Worker. Management plans discussed with the patient, her daughter and they are in agreement.  CODE STATUS: DNR  TOTAL TIME TAKING CARE OF THIS PATIENT: 36 minutes.   More than 50% of the time was spent in counseling/coordination of care: YES  POSSIBLE D/C IN 2 DAYS, DEPENDING ON CLINICAL CONDITION.   Shaune PollackQing Kelechi Orgeron M.D on 03/05/2018 at 3:52 PM  Between 7am to 6pm - Pager - [phone]  After 6pm go to [email or web address] - Therapist, nutritionalpassword EPAS [facility]  [facility] [place] [facility]

## 2018-03-05 NOTE — Progress Notes (Signed)
PT Cancellation Note  Patient Details Name: Vanessa GurneyBeverly [name] MRN: [id number] DOB: 09-14-1932   Cancelled Treatment:    Reason Eval/Treat Not Completed: Medical issues which prohibited therapy.  Pt s/p MRI of brain yesterday and results showing acute right pontine infarct.  Most recent potassium noted to be elevated at 6.0 this morning.  Per PT guidelines for elevated potassium >5.1, PT currently contraindicated.  Will re-attempt PT treatment session at a later date/time as medically appropriate.  Hendricks LimesEmily Rockland Kotarski, PT 03/05/18, 11:48 AM [phone]

## 2018-03-05 NOTE — Clinical Social Work Note (Signed)
CSW met with patient and her daughter Baker Janus to discuss discharge planning.  Patient and daughter have agreed to go to [facility], CSW contacted [facility] they will start insurance authorization.  Patient plans to transition to long term care with hospice to follow, from rehab, patient's daughter is aware that she will be private pay if hospice follows.  Patient's daughter did not express any other questions or concerns.  CSW to continue to follow patient's progress throughout discharge planning.  Jones Broom. Norval Morton, MSW, [place]  03/05/2018 5:47 PM

## 2018-03-05 NOTE — Progress Notes (Addendum)
Notified Dr. Imogene Burnhen of pt not having any output this shift, bladder scanned patient, yielded 225 cc, Dr. Imogene Burnhen notified, per Dr. Imogene Burnhen if > 400 cc do in and out cath. Will continue to monitor closely.

## 2018-03-05 NOTE — Consult Note (Addendum)
Consultation Note Date: 03/05/2018   Patient Name: [name]  DOB: 13-Nov-1932  MRN: [id number]  Age / Sex: 84 y.o., female  PCP: Katrinka Blazing, MD Referring Physician: Shaune Pollack, MD  Reason for Consultation: Establishing goals of care  HPI/Patient Profile: [name]  is a 84 y.o. female with a known history of hypertension, hyperlipidemia, vitamin D deficiency presented to the emergency room with generalized weakness.  Patient unable to ambulate for the last 2 days because of weakness.  According to the family member patient usually moves around at home with the help of a walker.  She has not been eating well and drinking enough fluids for the last couple of days.     Clinical Assessment and Goals of Care: Patient is resting in bed. Difficult to unintelligible speech. Daughter Dondra Spry at bedside. She is retired from [employer]. She has 2 children.  Dondra Spry states prior to a month ago, her mother drove and was independent. 1 month ago, she began to decline. Her oral intake decreased as well as her functional status. Sunday things were bad enough, she came to the hospital. She states her mother has further declined over the past few days since admission. Her appetite is poor and she has had coughing with liuid oral intake.   We discussed diagnosis, prognosis, GOC, EOL wishes disposition and options.  A detailed discussion was had today regarding advanced directives.  Concepts specific to code status, artifical feeding and hydration,  IV antibiotics and rehospitalization was discussed.  The difference between an aggressive medical intervention path  and a hospice comfort care path was discussed.  Values and goals of care important to patient and family were attempted to be elicited.  Ms. Leavelle states "no" she does not want any further testing. Her daughter states they would like to focus on comfort as she  has been advised her mother would not rehab from this. She would like facility placement with hospice care. She is unable to take her mother home with hospice care.   MOST form completed for DNR, Comfort measures, antibiotics determined with need, no IV fluids and no feeding tube. She does not want her mother brought back to the hospital. Lovenox discontinued per daughter as well as cholesterol medication.   Additionally, MOST form was found in chart from 09/2017 signed by the patient for DNR, comfort measures, no feeding tube, no IV fluids, and it appears, following a change, the box was checked for assessment of abx needs depending on the case.      SUMMARY OF RECOMMENDATIONS    Recommend D/C to nursing facility with hospice.   Code Status/Advance Care Planning:  DNR    Symptom Management:   Morphine and Norco for pain.  Ativan for anxiety  Haldol for agitation  Robinol for excessive secretions.   Palliative Prophylaxis:   Eye Care and Oral Care    Prognosis:   Poor. Poor PO intake, dysphagia. Stroke.   Discharge Planning: Nursing facility with hospice.  Primary Diagnoses: Present on Admission: . Hypokalemia   I have reviewed the medical record, interviewed the patient and family, and examined the patient. The following aspects are pertinent.  Past Medical History:  Diagnosis Date  . Hyperlipidemia   . Hypertension   . Vitamin D deficiency    Social History   Socioeconomic History  . Marital status: Unknown    Spouse name: Not on file  . Number of children: Not on file  . Years of education: Not on file  . Highest education level: Not on file  Occupational History  . Not on file  Social Needs  . Financial resource strain: Not on file  . Food insecurity:    Worry: Not on file    Inability: Not on file  . Transportation needs:    Medical: Not on file    Non-medical: Not on file  Tobacco Use  . Smoking status: Never Smoker  . Smokeless  tobacco: Never Used  Substance and Sexual Activity  . Alcohol use: Never    Frequency: Never  . Drug use: Never  . Sexual activity: Not Currently  Lifestyle  . Physical activity:    Days per week: Not on file    Minutes per session: Not on file  . Stress: Not on file  Relationships  . Social connections:    Talks on phone: Not on file    Gets together: Not on file    Attends religious service: Not on file    Active member of club or organization: Not on file    Attends meetings of clubs or organizations: Not on file    Relationship status: Not on file  Other Topics Concern  . Not on file  Social History Narrative  . Not on file   No family history on file. Scheduled Meds: . aspirin EC  325 mg Oral Daily  . captopril  25 mg Oral BID  . feeding supplement (NEPRO CARB STEADY)  237 mL Oral BID BM  . pregabalin  50 mg Oral TID   Continuous Infusions: PRN Meds:.acetaminophen **OR** acetaminophen, antiseptic oral rinse, glycopyrrolate **OR** [DISCONTINUED] glycopyrrolate **OR** glycopyrrolate, HYDROcodone-acetaminophen, morphine injection, ondansetron **OR** ondansetron (ZOFRAN) IV, polyvinyl alcohol, senna-docusate Medications Prior to Admission:  Prior to Admission medications   Medication Sig Start Date End Date Taking? Authorizing Provider  amLODipine (NORVASC) 10 MG tablet Take 1 tablet by mouth daily. 05/03/17  Yes [provider]  aspirin (ASPIR-LOW) 81 MG EC tablet Take 1 tablet by mouth daily. 09/15/10  Yes [provider]  atorvastatin (LIPITOR) 10 MG tablet Take 1 tablet by mouth daily. 05/03/17  Yes [provider]  captopril (CAPOTEN) 25 MG tablet Take 1 tablet by mouth 2 (two) times daily. 05/03/17  Yes [provider]  chlorthalidone (HYGROTON) 25 MG tablet Take 1 tablet by mouth daily. 11/05/17  Yes [provider]  Cholecalciferol (VITAMIN D3) 400 units CAPS Take 1 tablet by mouth 2 (two) times daily. 02/01/12  Yes [provider]  metoprolol tartrate (LOPRESSOR) 100 MG tablet Take 1 tablet by mouth 2 (two) times daily. 05/03/17  Yes [provider]  pregabalin (LYRICA) 50 MG capsule Take 1 capsule by mouth 3 (three) times daily. 04/17/17  Yes [provider]   No Known Allergies Review of Systems  Musculoskeletal:       Leg pain    Physical Exam  Constitutional: No distress.  HENT:  Mouth/Throat: Oropharyngeal exudate:    Pulmonary/Chest: Effort normal.  Neurological: She  is alert.  Skin: Skin is warm and dry.    Vital Signs: BP 128/63 (BP Location: Left Arm)   Pulse 65   Temp 97.8 F (36.6 C) (Oral)   Resp 17   Ht 5\' 1"  (1.549 m)   Wt 56.2 kg (124 lb)   SpO2 90%   BMI 23.43 kg/m  Pain Scale: 0-10   Pain Score: 7    SpO2: SpO2: 90 % O2 Device:SpO2: 90 % O2 Flow Rate: .   IO: Intake/output summary:   Intake/Output Summary (Last 24 hours) at 03/05/2018 1513 Last data filed at 03/05/2018 1300 Gross per 24 hour  Intake 580 ml  Output 450 ml  Net 130 ml    LBM: Last BM Date: 02/28/18 Baseline Weight: Weight: 63.5 kg (140 lb) Most recent weight: Weight: 56.2 kg (124 lb)     Palliative Assessment/Data: 30%     Time In:2:50 Time Out: 3:40 Time Total: 50 min Greater than 50%  of this time was spent counseling and coordinating care related to the above assessment and plan.  Signed by: Morton Stallrystal Davon Folta, NP   Please contact Palliative Medicine Team phone at [phone] for questions and concerns.  For individual provider: See Loretha StaplerAmion

## 2018-03-05 NOTE — Evaluation (Signed)
Clinical/Bedside Swallow Evaluation Patient Details  Name: Vanessa GurneyBeverly [name] MRN: [id number] Date of Birth: 12/12/1932  Today's Date: 03/05/2018 Time: SLP Start Time (ACUTE ONLY): 0920 SLP Stop Time (ACUTE ONLY): 1020 SLP Time Calculation (min) (ACUTE ONLY): 60 min  Past Medical History:  Past Medical History:  Diagnosis Date  . Hyperlipidemia   . Hypertension   . Vitamin D deficiency    HPI:  Pt is a 84 y.o. female with a known history of hypertension, hyperlipidemia, hip bursitis/pain, L2 deformity, vitamin D deficiency presented to the emergency room with generalized weakness.  Patient unable to ambulate for the last 2 days because of weakness.  According to the family member patient usually moves around at home with the help of a walker.  She has not been eating well and drinking enough fluids for the last couple of days.  Patient appears dry and dehydrated.  Pt has had a recent Fall; slurred speech.  MRI yesterday revealed a R Pontine stroke; pt has flaccidity in her Left U/LEs.    Assessment / Plan / Recommendation Clinical Impression  Pt appears to present w/ impaired oropharyngeal phase swallowing function w/ increased risk for aspiration secondary to the oropharyngeal phase dysphagia noted today. Pt exhibited oral phase deficits c/b decreased lingual coordination/control of boluses given; pt had to expectorate the soft soft bolus trial d/t this poor control for cohesion and mastication for swallowing - pt stated "it's falling apart" then spit it out. Pt appared to orally manage trials of puree and cleared appropriately post swallowing; timely A-P transit noted. During trials of thin liquids, pt again demonstrated decreased oral phase control during oral intake of the liquid from the cup and during A-P transfer for swallowing; suspect delayed pharyngeal swallow initiation. Pt exhibited a delayed cough and throat clear during trials. When the bolus trials were carefully monitored and small,  single sips, pt appeared to exhibit adequate control during swallowing w/ no further overt s/s of aspiration noted. Pt required feeding support w/ trials d/t LUE flaccidity and potential min Left Neglect. OM exam revealed Left oral weakness; lingual weakness and little-no gag reflex. Dysarthria present. Thorough education given to Daughter present and pt on aspiration precautions and the need for food consistency to be modified to a puree d/t the oral phase deficits and risk for choking. Recommended a Dysphagia level 1(puree) w/ Thin liquids; strict aspiration precautions; Pills given in Puree w/ NSG; feeding support at meals. ST services to f/u w/ montioring toleration of diet (thin liquids) and ability to upgrade food consistency next 1-2 days. Recommend a Palliative Care consult; Dietitian consult for support. MD/NSG updated post eval.  SLP Visit Diagnosis: Dysphagia, oropharyngeal phase (R13.12);Dysarthria and anarthria (R47.1)    Aspiration Risk  Mild aspiration risk;Moderate aspiration risk    Diet Recommendation  Dysphagia level 1 (puree) w/ Thin liquids VIA CUP ONLY; strict aspiration precautions; feeding support at meals. Check for oral clearing post meals.  Medication Administration: Whole meds with puree(or Crushed in puree as needed for safer swallowing)    Other  Recommendations Recommended Consults: (Dietitian and Palliative Care consult) Oral Care Recommendations: Oral care BID;Staff/trained caregiver to provide oral care;Patient independent with oral care Other Recommendations: (TBD)   Follow up Recommendations Skilled Nursing facility      Frequency and Duration min 3x week  2 weeks       Prognosis Prognosis for Safe Diet Advancement: Fair(-Good) Barriers to Reach Goals: Motivation      Swallow Study   General Date of  Onset: 03/03/18 HPI: Pt is a 84 y.o. female with a known history of hypertension, hyperlipidemia, hip bursitis/pain, L2 deformity, vitamin D deficiency  presented to the emergency room with generalized weakness.  Patient unable to ambulate for the last 2 days because of weakness.  According to the family member patient usually moves around at home with the help of a walker.  She has not been eating well and drinking enough fluids for the last couple of days.  Patient appears dry and dehydrated.  Pt has had a recent Fall; slurred speech.  MRI yesterday revealed a R Pontine stroke; pt has flaccidity in her Left U/LEs.  Type of Study: Bedside Swallow Evaluation Previous Swallow Assessment: none reported Diet Prior to this Study: Regular;Thin liquids Temperature Spikes Noted: No(wbc 8.7) Respiratory Status: Room air History of Recent Intubation: No Behavior/Cognition: Alert;Cooperative;Pleasant mood;Requires cueing(drowsy initially) Oral Cavity Assessment: Dry;Excessive secretions(sticky secretions) Oral Care Completed by SLP: Yes Oral Cavity - Dentition: Adequate natural dentition;Missing dentition(1-2 missing teeth only) Vision: Functional for self-feeding Self-Feeding Abilities: Able to feed self;Needs assist;Needs set up;Total assist(LUE flaccidity) Patient Positioning: Upright in bed Baseline Vocal Quality: Low vocal intensity(some dysarthria) Volitional Cough: (Fair) Volitional Swallow: Able to elicit    Oral/Motor/Sensory Function Overall Oral Motor/Sensory Function: Mild impairment(-Moderate impairment) Facial ROM: Reduced left(slight) Facial Symmetry: Abnormal symmetry left(slight) Facial Strength: Reduced left(slight) Facial Sensation: Reduced left(slightlly) Lingual ROM: Within Functional Limits Lingual Symmetry: Within Functional Limits Lingual Strength: Reduced(especially posterior strength) Lingual Sensation: Within Functional Limits Velum: Within Functional Limits Mandible: Within Functional Limits   Ice Chips Ice chips: Within functional limits Presentation: Spoon(fed; 3 trials)   Thin Liquid Thin Liquid:  Impaired Presentation: Cup;Self Fed(assisted/supported; 10 trials) Oral Phase Impairments: Reduced labial seal;Poor awareness of bolus;Reduced lingual movement/coordination(slight) Oral Phase Functional Implications: (reduced coordination apparent) Pharyngeal  Phase Impairments: Suspected delayed Swallow;Cough - Delayed;Throat Clearing - Delayed(x1 each ) Other Comments: control and presentation improved w/ Smaller sip/bolus size w/ no further overt s/s of aspiration noted    Nectar Thick Nectar Thick Liquid: Not tested   Honey Thick Honey Thick Liquid: Not tested   Puree Puree: Within functional limits Presentation: Spoon(fed; 10+ trials)   Solid   GO   Solid: Impaired(soft solids) Presentation: Spoon(fed; 1 trial) Oral Phase Impairments: Reduced lingual movement/coordination;Poor awareness of bolus;Impaired mastication Oral Phase Functional Implications: Oral residue(diffuse; expectorated bolus) Pharyngeal Phase Impairments: (n/a)         Jerilynn Som, MS, CCC-SLP Watson,Katherine 03/05/2018,11:26 AM

## 2018-03-05 NOTE — Progress Notes (Signed)
OT Cancellation Note  Patient Details Name: Vanessa GurneyBeverly [name] MRN: [id number] DOB: 1932/09/24   Cancelled Treatment:    Reason Eval/Treat Not Completed: Medical issues which prohibited therapy. Chart reviewed. Pt now with K+ 6.0. Contraindicated for participation in therapy secondary to K+, per therapy guidelines. Will continue to follow and re-attempt OT evaluation at later date/time as pt is medically appropriate.   Richrd PrimeJamie Stiller, MPH, MS, OTR/L ascom [phone] 03/05/18, 10:39 AM

## 2018-03-05 NOTE — Consult Note (Signed)
Reason for Consult: weakness  Referring Physician: Dr. Imogene Burnhen   CC: Weakness   HPI: Vanessa GurneyBeverly [name] is an 84 y.o. female  with a known history of hypertension, hyperlipidemia, vitamin D deficiency presented to the emergency room with generalized weakness that has been getting worse for the past month.  Patient unable to ambulate for the past few days prior to admission. She has a R pontine infarct.      Past Medical History:  Diagnosis Date  . Hyperlipidemia   . Hypertension   . Vitamin D deficiency      No family history on file.  Social History:  reports that she has never smoked. She has never used smokeless tobacco. She reports that she does not drink alcohol or use drugs.  No Known Allergies  Medications: I have reviewed the patient's current medications.  ROS: Confusion, difficult to obtain   Physical Examination: Blood pressure 128/63, pulse 65, temperature 97.8 F (36.6 C), temperature source Oral, resp. rate 17, height 5\' 1"  (1.549 m), weight 124 lb (56.2 kg), SpO2 90 %.  Neurological Examination   Mental Status: Alert, oriented to name and date.  Cranial Nerves: II: Discs flat bilaterally; Visual fields grossly normal, pupils equal, round, reactive to light and accommodation III,IV, VI: ptosis not present, extra-ocular motions intact bilaterally V,VII: smile symmetric, facial light touch sensation normal bilaterally VIII: hearing normal bilaterally IX,X: gag reflex present XI: bilateral shoulder shrug XII: midline tongue extension Motor: Right : Upper extremity   5/5    Left:     Upper extremity   0/5  Lower extremity   4/5     Lower extremity   0/5 Tone and bulk:normal tone throughout; no atrophy noted Sensory: Pinprick and light touch intact throughout, bilaterally Deep Tendon Reflexes: `+ and symmetric throughout Plantars: Right: downgoing   Left: downgoing Cerebellar: Not tested Gait: not tested     Laboratory Studies:   Basic Metabolic  Panel: Recent Labs  Lab 03/03/18 1356 03/04/18 0452 03/05/18 0653 03/05/18 1001  NA 137 138 139  --   K 2.3* 2.9* 6.5*  6.2* 6.0*  CL 93* 102 112*  --   CO2 27 26 22   --   GLUCOSE 119* 121* 112*  --   BUN 38* 31* 25*  --   CREATININE 0.97 0.83 0.72  --   CALCIUM 9.7 8.9 8.5*  --   MG 2.2  --   --   --     Liver Function Tests: Recent Labs  Lab 03/03/18 1356  AST 22  ALT 22  ALKPHOS 85  BILITOT 1.1  PROT 6.9  ALBUMIN 3.0*   Recent Labs  Lab 03/03/18 1356  LIPASE 57*   No results for input(s): AMMONIA in the last 168 hours.  CBC: Recent Labs  Lab 03/03/18 1356 03/04/18 0452  WBC 8.7 8.7  HGB 11.6* 9.8*  HCT 33.5* 28.3*  MCV 92.3 92.8  PLT 285 245    Cardiac Enzymes: Recent Labs  Lab 03/03/18 1356  CKTOTAL 31*  TROPONINI 0.04*    BNP: Invalid input(s): POCBNP  CBG: No results for input(s): GLUCAP in the last 168 hours.  Microbiology: Results for orders placed or performed during the hospital encounter of 03/03/18  Urine Culture     Status: None   Collection Time: 03/03/18  2:17 PM  Result Value Ref Range Status   Specimen Description   Final    URINE, CATHETERIZED Performed at [facility], [address] [place], [place]  [address]    Special Requests   Final    Normal Performed at [facility], [address] [place], [place] [address]    Culture   Final    NO GROWTH Performed at [facility], [address] [place], [place] [address]    Report Status 03/04/2018 FINAL  Final    Coagulation Studies: No results for input(s): LABPROT, INR in the last 72 hours.  Urinalysis:  Recent Labs  Lab 03/03/18 1417  COLORURINE YELLOW*  LABSPEC 1.020  PHURINE 6.0  GLUCOSEU NEGATIVE  HGBUR NEGATIVE  BILIRUBINUR NEGATIVE  KETONESUR 20*  PROTEINUR NEGATIVE  NITRITE NEGATIVE  LEUKOCYTESUR SMALL*    Lipid Panel:     Component Value Date/Time   CHOL 116 03/05/2018 0653   TRIG 121 03/05/2018 0653   HDL 38  (L) 03/05/2018 0653   CHOLHDL 3.1 03/05/2018 0653   VLDL 24 03/05/2018 0653   LDLCALC 54 03/05/2018 0653    HgbA1C:  Lab Results  Component Value Date   HGBA1C 6.2 (H) 03/05/2018    Urine Drug Screen:  No results found for: LABOPIA, COCAINSCRNUR, LABBENZ, AMPHETMU, THCU, LABBARB  Alcohol Level: No results for input(s): ETH in the last 168 hours.   Imaging: Dg Chest 2 View  Result Date: 03/03/2018 CLINICAL DATA:  84 y/o F; generalized weakness and decreased appetite. EXAM: CHEST - 2 VIEW COMPARISON:  None. FINDINGS: Mild cardiomegaly. Aortic atherosclerosis with calcification. Clear lungs. No pleural effusion or pneumothorax. No acute osseous abnormality is evident. L2 compression deformity, age indeterminate. IMPRESSION: 1. No acute pulmonary process identified. 2. Aortic atherosclerosis and mild cardiomegaly. 3. Age indeterminate L2 compression deformity. Electronically Signed   By: Mitzi Hansen M.D.   On: 03/03/2018 15:17   Ct Head Wo Contrast  Result Date: 03/03/2018 CLINICAL DATA:  84 y/o F; 3-4 days of increased weakness, dizziness, some confusion. EXAM: CT HEAD WITHOUT CONTRAST TECHNIQUE: Contiguous axial images were obtained from the base of the skull through the vertex without intravenous contrast. COMPARISON:  None. FINDINGS: Brain: No evidence of acute infarction, hemorrhage, hydrocephalus, extra-axial collection or mass lesion/mass effect. Nonspecific foci of hypoattenuation in subcortical periventricular white matter are compatible with moderate chronic microvascular ischemic changes. Moderate diffuse brain parenchymal volume loss. Foci of hypoattenuation within bilateral thalami, lentiform nuclei, and the left caudate head are likely to represent chronic lacunar infarcts. Vascular: Calcific atherosclerosis of the carotid siphons and bilateral M1. No hyperdense vessel identified. Skull: Normal. Negative for fracture or focal lesion. Sinuses/Orbits: No acute finding.  Other: Bilateral intra-ocular lens replacement. IMPRESSION: 1. No acute intracranial abnormality identified. 2. Moderate chronic microvascular ischemic changes and parenchymal volume loss of the brain. Several chronic lacunar infarcts in the basal ganglia. Electronically Signed   By: Mitzi Hansen M.D.   On: 03/03/2018 18:37   Mr Brain Wo Contrast  Result Date: 03/04/2018 CLINICAL DATA:  Generalized muscle weakness EXAM: MRI HEAD WITHOUT CONTRAST TECHNIQUE: Multiplanar, multiecho pulse sequences of the brain and surrounding structures were obtained without intravenous contrast. COMPARISON:  Head CT from yesterday FINDINGS: Brain: Right pontine restricted diffusion respecting the midline. Chronic small vessel ischemia in the cerebral white matter, overall mild for age. There are dilated perivascular spaces in the deep gray nuclei and upper brainstem. Mild for age cerebral volume loss that is generalized. No hemorrhage, hydrocephalus, or masslike finding. Vascular: Major flow voids are preserved Skull and upper cervical spine: No evidence of marrow lesion. Cervical facet spurring Sinuses/Orbits: Bilateral cataract resection.  Gaze to  the left. Other: Progressively motion degraded study. Final sequence (coronal T2) is nondiagnostic. IMPRESSION: Acute right pontine infarct. Electronically Signed   By: Marnee Spring M.D.   On: 03/04/2018 12:20   US Carotid Bilateral (at [facility] Only)  Result Date: 03/04/2018 CLINICAL DATA:  Stroke EXAM: BILATERAL CAROTID DUPLEX ULTRASOUND TECHNIQUE: Wallace Cullens scale imaging, color Doppler and duplex ultrasound were performed of bilateral carotid and vertebral arteries in the neck. COMPARISON:  None. FINDINGS: Criteria: Quantification of carotid stenosis is based on velocity parameters that correlate the residual internal carotid diameter with NASCET-based stenosis levels, using the diameter of the distal internal carotid lumen as the denominator for stenosis measurement.  The following velocity measurements were obtained: RIGHT ICA:  89 cm/sec CCA:  72 cm/sec SYSTOLIC ICA/CCA RATIO:  1.2 DIASTOLIC ICA/CCA RATIO: ECA:  81 cm/sec LEFT ICA:  85 cm/sec CCA:  52 cm/sec SYSTOLIC ICA/CCA RATIO:  1.6 DIASTOLIC ICA/CCA RATIO: ECA:  83 cm/sec RIGHT CAROTID ARTERY: Mild calcified plaque in the bulb. Low resistance internal carotid Doppler pattern is preserved. RIGHT VERTEBRAL ARTERY:  Antegrade. LEFT CAROTID ARTERY: Mild calcified plaque in the bulb. Low resistance internal carotid Doppler pattern is preserved. LEFT VERTEBRAL ARTERY:  Antegrade. IMPRESSION: Less than 50% stenosis in the right and left internal carotid arteries. Electronically Signed   By: Jolaine Click M.D.   On: 03/04/2018 15:22     Assessment/Plan:  84 y.o. female  with a known history of hypertension, hyperlipidemia, vitamin D deficiency presented to the emergency room with generalized weakness that has been getting worse for the past month.  Patient unable to ambulate for the past few days prior to admission. She has a R pontine infarct.    -  Discussion with pt's daughter and Dr. Imogene Burn at bedside -  Pt is DNR and wishes are clear.  She has been deteriorating in the past 1 month and was living with daughter.  Due to stroke she will need full time assistance and likely will need SNIFF placement which she would not want - stroke is posterior circulation related.  Will wait on obtain CTA due to care planning -  Would appreciate palliative care evaluation as there strong potential and focus on comfort measures   - Please call with questions.   03/05/2018, 1:24 PM

## 2018-03-05 NOTE — Plan of Care (Signed)
  Problem: Health Behavior/Discharge Planning: Goal: Ability to manage health-related needs will improve Outcome: Progressing   Problem: Clinical Measurements: Goal: Will remain free from infection Outcome: Progressing   Problem: Education: Goal: Knowledge of disease or condition will improve Outcome: Progressing   Problem: Ischemic Stroke/TIA Tissue Perfusion: Goal: Complications of ischemic stroke/TIA will be minimized Outcome: Progressing

## 2018-03-05 NOTE — Progress Notes (Signed)
Advanced Care Plan.  Purpose of Encounter: Palliative care. Parties in Attendance: The patient, her daughter, Dr. Loretha BrasilZeylikman and me. Patient's Decisional Capacity: Yes. Medical Story:  84 year old elderly female patient with history of hyperlipidemia, hypertension,vitaminD deficiency presented to the emergency room for weakness She was found acute CVA with dysarthria, dysphagia and left-sided weakness.  Per her daughter, the patient has been declining recently.  I discussed about palliative care.  They agreed to meet palliative care staff.  Plan:  Code Status: DNR. Time spent discussing advance care planning: 20 minutes.

## 2018-03-05 NOTE — Progress Notes (Signed)
CRITICAL VALUE ALERT  Critical Value:  K 6.5  Date & Time Notied:  03/05/18.0945  Provider Notified: Dr. Imogene Burnhen  Orders Received/Actions taken: see orders

## 2018-03-06 MED ORDER — BIOTENE DRY MOUTH MT LIQD: 15.0000 mL | OROMUCOSAL | Status: AC | PRN

## 2018-03-06 MED ORDER — ONDANSETRON 4 MG PO TBDP: 4.0000 mg | ORAL_TABLET | Freq: Four times a day (QID) | ORAL | 0 refills | Status: AC | PRN

## 2018-03-06 MED ORDER — SENNOSIDES-DOCUSATE SODIUM 8.6-50 MG PO TABS: 1.0000 | ORAL_TABLET | Freq: Every evening | ORAL | Status: AC | PRN

## 2018-03-06 MED ORDER — ASPIRIN 325 MG PO TBEC: 325.0000 mg | DELAYED_RELEASE_TABLET | Freq: Every day | ORAL | 0 refills | Status: AC

## 2018-03-06 MED ORDER — GLYCOPYRROLATE 1 MG PO TABS: 1.0000 mg | ORAL_TABLET | ORAL | Status: AC | PRN

## 2018-03-06 MED ORDER — POLYVINYL ALCOHOL 1.4 % OP SOLN: 1.0000 [drp] | Freq: Four times a day (QID) | OPHTHALMIC | 0 refills | Status: AC | PRN

## 2018-03-06 NOTE — Plan of Care (Signed)
  Problem: Education: Goal: Knowledge of disease or condition will improve Outcome: Progressing   Problem: Ischemic Stroke/TIA Tissue Perfusion: Goal: Complications of ischemic stroke/TIA will be minimized Outcome: Progressing   

## 2018-03-06 NOTE — Clinical Social Work Note (Signed)
CSW met with patient's daughter Baker Janus who had some questions regarding discharge.  Patient's daughter asked about if there is a way that physician can say patient does not have capacity in order for patient's daughter to access patient's bank account per request of patient.  CSW spoke with the physician who did not feel comfortable confirming that patient does not have capacity.  CSW spoke to [employer], and their social worker there can assist with helping patient's daughter receive the required information for the bank.  CSW updated patient's daughter and she is aware.   CSW received phone call from [facility] that they have received insurance authorization for patient to discharge today.  Patient to be d/c'ed today to [facility] room 804.  Patient and family agreeable to plans will transport via ems RN to call report to Yaakov Guthrie at [phone].  Patient's daughter is aware that patient is discharging today.  Evette Cristal, MSW, [place]

## 2018-03-06 NOTE — Progress Notes (Signed)
Daily Progress Note   Patient Name: Vanessa GurneyBeverly Gin       Date: 03/06/2018 DOB: 04/14/1933  Age: 84 y.o. MRN#: [id number] Attending Physician: Shaune Pollackhen, Qing, MD Primary Care Physician: Katrinka BlazingHalpert, Karen D, MD Admit Date: 03/03/2018  Reason for Consultation/Follow-up: Establishing goals of care  Subjective:  Patient is sitting up in bed with her eyes closed. She opens her eyes to voice. Speech remains difficult to understand, but less so than yesterday. She states her name accurately, but does not know where she is or who the president is. She is confused. Per staff she ate a few bites of oatmeal this morning. Voice sounds clear (not wet) today, with no audible congestion noted.   Length of Stay: 3  Current Medications: Scheduled Meds:  . aspirin EC  325 mg Oral Daily  . captopril  25 mg Oral BID  . feeding supplement (NEPRO CARB STEADY)  237 mL Oral BID BM  . pregabalin  50 mg Oral TID    Continuous Infusions:   PRN Meds: acetaminophen **OR** acetaminophen, antiseptic oral rinse, glycopyrrolate **OR** [DISCONTINUED] glycopyrrolate **OR** glycopyrrolate, HYDROcodone-acetaminophen, morphine injection, ondansetron **OR** ondansetron (ZOFRAN) IV, polyvinyl alcohol, senna-docusate  Physical Exam  Constitutional: No distress.  Pulmonary/Chest: Effort normal.  Neurological: She is alert.  Skin: Skin is warm and dry.            Vital Signs: BP (!) 142/75 (BP Location: Left Arm)   Pulse 73   Temp (!) 97.4 F (36.3 C) (Oral)   Resp 16   Ht 5\' 1"  (1.549 m)   Wt 60.8 kg (134 lb)   SpO2 98%   BMI 25.32 kg/m  SpO2: SpO2: 98 % O2 Device: O2 Device: Nasal Cannula O2 Flow Rate: O2 Flow Rate (L/min): 3 L/min  Intake/output summary:   Intake/Output Summary (Last 24 hours) at 03/06/2018 1025 Last  data filed at 03/06/2018 0700 Gross per 24 hour  Intake 120 ml  Output 480 ml  Net -360 ml   LBM: Last BM Date: 02/28/18 Baseline Weight: Weight: 63.5 kg (140 lb) Most recent weight: Weight: 60.8 kg (134 lb)       Palliative Assessment/Data: 20%    Flowsheet Rows     Most Recent Value  Intake Tab  Referral Department  Hospitalist  Unit at Time of Referral  Med/Surg Unit  Palliative Care Primary Diagnosis  Neurology  Date Notified  03/05/18  Palliative Care Type  New Palliative care  Reason for referral  Clarify Goals of Care  Date of Admission  03/03/18  Date first seen by Palliative Care  03/05/18  # of days Palliative referral response time  0 Day(s)  # of days IP prior to Palliative referral  2  Clinical Assessment  Psychosocial & Spiritual Assessment  Palliative Care Outcomes      Patient Active Problem List   Diagnosis Date Noted  . Hypokalemia 03/03/2018    Palliative Care Assessment & Plan   Patient Profile: [name] a85 y.o.femalewith a known history of hypertension, hyperlipidemia, vitamin D deficiency presented to the emergency room with generalized weakness.Patient unable to ambulate for the last 2 days because of weakness.According to the family member patient usually moves around at home with the help of a walker.She has not been eating well and drinking enough fluids for the last couple of days.   Assessment/Recommendations/Plan:  Plans for nursing facility with hospice care.    Code Status:    Code Status Orders  (From admission, onward)        Start     Ordered   03/05/18 1452  Do not attempt resuscitation (DNR)  Continuous    Question Answer Comment  In the event of cardiac or respiratory ARREST Do not call a "code blue"   In the event of cardiac or respiratory ARREST Do not perform Intubation, CPR, defibrillation or ACLS   In the event of cardiac or respiratory ARREST Use medication by any route, position, wound care,  and other measures to relive pain and suffering. May use oxygen, suction and manual treatment of airway obstruction as needed for comfort.      03/05/18 1452    Code Status History    Date Active Date Inactive Code Status Order ID Comments User Context   03/03/2018 1604 03/05/2018 1452 DNR [id number]  Ihor Austin, MD ED    Advance Directive Documentation     Most Recent Value  Type of Advance Directive  Out of facility DNR (pink MOST or yellow form)  Pre-existing out of facility DNR order (yellow form or pink MOST form)  Physician notified to receive inpatient order  "MOST" Form in Place?  -       Prognosis:   < 6 months Poor PO intake. Acute CVA.   Discharge Planning:  Nursing facility.   Care plan was discussed with Dr. Imogene Burn, SW.   Thank you for allowing the Palliative Medicine Team to assist in the care of this patient.   Total Time 35 min Prolonged Time Billed  NO      Greater than 50%  of this time was spent counseling and coordinating care related to the above assessment and plan.  Morton Stall, NP  Please contact Palliative Medicine Team phone at [phone] for questions and concerns.

## 2018-03-06 NOTE — Discharge Instructions (Signed)
Aspiration and fall precaution. Dysphagia 1 diet. Hospice care

## 2018-03-06 NOTE — Progress Notes (Signed)
Pt to be d/c to [facility] per MD order, Report called to Brittney, LPN at [facility], [facility] has been called for transportation.

## 2018-03-06 NOTE — Clinical Social Work Placement (Signed)
   CLINICAL SOCIAL WORK PLACEMENT  NOTE  Date:  03/06/2018  Patient Details  Name: [name] MRN: [id number] Date of Birth: August 01, 1933  Clinical Social Work is seeking post-discharge placement for this patient at the Skilled  Nursing Facility level of care (*CSW will initial, date and re-position this form in  chart as items are completed):  Yes   Patient/family provided with [facility] Clinical Social Work Department's list of facilities offering this level of care within the geographic area requested by the patient (or if unable, by the patient's family).  Yes   Patient/family informed of their freedom to choose among providers that offer the needed level of care, that participate in Medicare, Medicaid or managed care program needed by the patient, have an available bed and are willing to accept the patient.  Yes   Patient/family informed of [facility]'s ownership interest in [facility] and [facility], as well as of the fact that they are under no obligation to receive care at these facilities.  PASRR submitted to EDS on 03/04/18     PASRR number received on 03/04/18     Existing PASRR number confirmed on       FL2 transmitted to all facilities in geographic area requested by pt/family on 03/04/18     FL2 transmitted to all facilities within larger geographic area on       Patient informed that his/her managed care company has contracts with or will negotiate with certain facilities, including the following:        Yes   Patient/family informed of bed offers received.  [facility]     Physician recommends and patient chooses bed at      Patient to be transferred to [facility] on 03/06/18.  Patient to be transferred to facility by [facility]     Patient family notified on 03/06/18 of transfer.  Name of family member notified:  Patient's daughter [name]     PHYSICIAN Please sign FL2, Please sign DNR      Additional Comment:    _______________________________________________ Darleene CleaverAnterhaus, Mckinnon Glick R, LCSWA 03/06/2018, 1:09 PM

## 2018-03-06 NOTE — Plan of Care (Signed)
  Problem: Clinical Measurements: Goal: Diagnostic test results will improve Outcome: Progressing   Problem: Elimination: Goal: Will not experience complications related to urinary retention Outcome: Not Progressing

## 2018-03-06 NOTE — Care Management Important Message (Signed)
Copy of signed IM left with patient in room.  

## 2018-03-06 NOTE — Plan of Care (Signed)
  Problem: Coping: Goal: Will verbalize positive feelings about self Outcome: Not Progressing   Problem: Nutrition: Goal: Risk of aspiration will decrease Outcome: Progressing

## 2018-03-06 NOTE — Progress Notes (Addendum)
Elliot GurneyBeverly Titsworth to be D/C'd Skilled nursing facility ([facility]) per MD order.   [facility] at [facility],   Allergies as of 03/06/2018   No Known Allergies     Medication List    STOP taking these medications   amLODipine 10 MG tablet Commonly known as:  NORVASC   atorvastatin 10 MG tablet Commonly known as:  LIPITOR   captopril 25 MG tablet Commonly known as:  CAPOTEN   chlorthalidone 25 MG tablet Commonly known as:  HYGROTON   metoprolol tartrate 100 MG tablet Commonly known as:  LOPRESSOR   Vitamin D3 400 units Caps     TAKE these medications   antiseptic oral rinse Liqd Apply 15 mLs topically as needed for dry mouth.   aspirin 325 MG EC tablet Take 1 tablet (325 mg total) by mouth daily. Start taking on:  03/07/2018 What changed:    medication strength  how much to take   glycopyrrolate 1 MG tablet Commonly known as:  ROBINUL Take 1 tablet (1 mg total) by mouth every 4 (four) hours as needed (excessive secretions).   LYRICA 50 MG capsule Generic drug:  pregabalin Take 1 capsule by mouth 3 (three) times daily.   ondansetron 4 MG disintegrating tablet Commonly known as:  ZOFRAN-ODT Take 1 tablet (4 mg total) by mouth every 6 (six) hours as needed for nausea.   polyvinyl alcohol 1.4 % ophthalmic solution Commonly known as:  LIQUIFILM TEARS Place 1 drop into both eyes 4 (four) times daily as needed for dry eyes.   senna-docusate 8.6-50 MG tablet Commonly known as:  Senokot-S Take 1 tablet by mouth at bedtime as needed for mild constipation.       Vitals:   03/06/18 0751 03/06/18 1317  BP: (!) 142/75 (!) 120/54  Pulse: 73 67  Resp:  18  Temp: (!) 97.4 F (36.3 C)   SpO2: 98% 100%    Tele box removed and returned. Skin clean, dry and intact without evidence of skin break down, no evidence of skin tears noted. IV catheter discontinued intact. Site without signs and symptoms of complications. Dressing and pressure applied. Pt denies  pain at this time. No complaints noted. Pt escorted via EMS.  Rigoberto NoelErica Y Larry Alcock

## 2018-03-06 NOTE — Discharge Summary (Signed)
[employer] [facility] - [place] [facility]   PATIENT NAME: [name]    MR#:  [id number]  DATE OF BIRTH:  09-22-1932  DATE OF ADMISSION:  03/03/2018   ADMITTING PHYSICIAN: Ihor Austin, MD  DATE OF DISCHARGE: 03/06/2018 PRIMARY CARE PHYSICIAN: Katrinka Blazing, MD   ADMISSION DIAGNOSIS:  Hypokalemia [E87.6] Weakness [R53.1] Generalized weakness [R53.1] Urinary tract infection, acute [N39.0] Unable to ambulate [R26.2] DISCHARGE DIAGNOSIS:  Active Problems:   Hypokalemia  SECONDARY DIAGNOSIS:   Past Medical History:  Diagnosis Date  . Hyperlipidemia   . Hypertension   . Vitamin D deficiency    HOSPITAL COURSE:  84 year old elderly female patient with history of hyperlipidemia, hypertension,vitaminD deficiency presented to the emergency room for weakness  -Acute CVA with left-sided weakness, unknown time window. Continue ASA 325, lipitor, neruo check, echocardiograph, carotid duplex: Less than 50% stenosis in the right and left internal carotid Arteries. Per Dr. Loretha Brasil, head CT angiogram if the patient and daughter want further work-up.  Dysphagia.  Aspiration precaution.  Hypokalemia Replaced potassium intravenously, potassium was up to 6.0. 4.9 today.  Hyperkalemia.  Given gluconate, D50 and NovoLog. Improved.  -Dehydration Improved with IV fluid support.  -Hypertension  Hold HTN home medications due to low blood pressure.   PT evaluation suggest skilled nursing facility placement.  Very poor prognosis. Hospice care at SNF. I discussed with palliative care staff Crystal. DISCHARGE CONDITIONS:  Stable, discharge to SNF with hospice care today. CONSULTS OBTAINED:  Treatment Team:  Pauletta Browns, MD DRUG ALLERGIES:  No Known Allergies DISCHARGE MEDICATIONS:   Allergies as of 03/06/2018   No Known Allergies     Medication List    STOP taking these medications   amLODipine 10 MG tablet Commonly known as:  NORVASC     atorvastatin 10 MG tablet Commonly known as:  LIPITOR   captopril 25 MG tablet Commonly known as:  CAPOTEN   chlorthalidone 25 MG tablet Commonly known as:  HYGROTON   metoprolol tartrate 100 MG tablet Commonly known as:  LOPRESSOR   Vitamin D3 400 units Caps     TAKE these medications   antiseptic oral rinse Liqd Apply 15 mLs topically as needed for dry mouth.   aspirin 325 MG EC tablet Take 1 tablet (325 mg total) by mouth daily. Start taking on:  03/07/2018 What changed:    medication strength  how much to take   glycopyrrolate 1 MG tablet Commonly known as:  ROBINUL Take 1 tablet (1 mg total) by mouth every 4 (four) hours as needed (excessive secretions).   LYRICA 50 MG capsule Generic drug:  pregabalin Take 1 capsule by mouth 3 (three) times daily.   ondansetron 4 MG disintegrating tablet Commonly known as:  ZOFRAN-ODT Take 1 tablet (4 mg total) by mouth every 6 (six) hours as needed for nausea.   polyvinyl alcohol 1.4 % ophthalmic solution Commonly known as:  LIQUIFILM TEARS Place 1 drop into both eyes 4 (four) times daily as needed for dry eyes.   senna-docusate 8.6-50 MG tablet Commonly known as:  Senokot-S Take 1 tablet by mouth at bedtime as needed for mild constipation.        DISCHARGE INSTRUCTIONS:  See AVS. If you experience worsening of your admission symptoms, develop shortness of breath, life threatening emergency, suicidal or homicidal thoughts you must seek medical attention immediately by calling 911 or calling your MD immediately  if symptoms less severe.  You Must read complete instructions/literature along with all the possible  adverse reactions/side effects for all the Medicines you take and that have been prescribed to you. Take any new Medicines after you have completely understood and accpet all the possible adverse reactions/side effects.   Please note  You were cared for by a hospitalist during your hospital stay. If you have  any questions about your discharge medications or the care you received while you were in the hospital after you are discharged, you can call the unit and asked to speak with the hospitalist on call if the hospitalist that took care of you is not available. Once you are discharged, your primary care physician will handle any further medical issues. Please note that NO REFILLS for any discharge medications will be authorized once you are discharged, as it is imperative that you return to your primary care physician (or establish a relationship with a primary care physician if you do not have one) for your aftercare needs so that they can reassess your need for medications and monitor your lab values.    On the day of Discharge:  VITAL SIGNS:  Blood pressure (!) 142/75, pulse 73, temperature (!) 97.4 F (36.3 C), temperature source Oral, resp. rate 16, height 5\' 1"  (1.549 m), weight 134 lb (60.8 kg), SpO2 98 %. PHYSICAL EXAMINATION:  GENERAL:  84 y.o.-year-old patient lying in the bed with no acute distress.  EYES: Pupils equal, round, reactive to light and accommodation. No scleral icterus. Extraocular muscles intact.  HEENT: Head atraumatic, normocephalic. NECK:  Supple, no jugular venous distention. No thyroid enlargement, no tenderness.  LUNGS: Normal breath sounds bilaterally, no wheezing, rales,rhonchi or crepitation. No use of accessory muscles of respiration.  CARDIOVASCULAR: S1, S2 normal. No murmurs, rubs, or gallops.  ABDOMEN: Soft, non-tender, non-distended. Bowel sounds present. No organomegaly or mass.  EXTREMITIES: No pedal edema, cyanosis, or clubbing.  NEUROLOGIC: She is confused, not follow commands. SKIN: No obvious rash, lesion, or ulcer.  DATA REVIEW:   CBC Recent Labs  Lab 03/04/18 0452  WBC 8.7  HGB 9.8*  HCT 28.3*  PLT 245    Chemistries  Recent Labs  Lab 03/03/18 1356  03/05/18 0653  03/05/18 1808  NA 137   < > 139  --   --   K 2.3*   < > 6.5*  6.2*   < >  4.9  CL 93*   < > 112*  --   --   CO2 27   < > 22  --   --   GLUCOSE 119*   < > 112*  --   --   BUN 38*   < > 25*  --   --   CREATININE 0.97   < > 0.72  --   --   CALCIUM 9.7   < > 8.5*  --   --   MG 2.2  --   --   --   --   AST 22  --   --   --   --   ALT 22  --   --   --   --   ALKPHOS 85  --   --   --   --   BILITOT 1.1  --   --   --   --    < > = values in this interval not displayed.     Microbiology Results  Results for orders placed or performed during the hospital encounter of 03/03/18  Urine Culture     Status: None  Collection Time: 03/03/18  2:17 PM  Result Value Ref Range Status   Specimen Description   Final    URINE, CATHETERIZED Performed at [facility], [address] [place], [place] [address]    Special Requests   Final    Normal Performed at [facility], [address] [place], [place] [address]    Culture   Final    NO GROWTH Performed at [facility], [address], [place], [place] [address]    Report Status 03/04/2018 FINAL  Final    RADIOLOGY:  No results found.   Management plans discussed with the patient, her daughter and they are in agreement.  CODE STATUS: DNR   TOTAL TIME TAKING CARE OF THIS PATIENT: 36 minutes.    Shaune Pollack M.D on 03/06/2018 at 10:39 AM  Between 7am to 6pm - Pager - [phone]  After 6pm go to [email or web address] - Scientist, research (life sciences) [facility]  Office  [phone]  CC: Primary care physician; Katrinka Blazing, MD   Note: This dictation was prepared with Dragon dictation along with smaller phrase technology. Any transcriptional errors that result from this process are unintentional.

## 2018-04-04 DEATH — deceased

## 2018-11-17 IMAGING — CT CT HEAD W/O CM
3 series · 15 of 47 positions shown, 18 images · non-contrast
Comparison: None.

CLINICAL DATA: 85 y/o F; 3-4 days of increased weakness, dizziness,
some confusion.

EXAM:
CT HEAD WITHOUT CONTRAST
TECHNIQUE: Contiguous axial images were obtained from the base of the skull
through the vertex without intravenous contrast.

[Series 3: head wo · axial · 0.40mm/px · z∈[+375,+500]mm · 9 of 30 slices shown, 12 images]
[im 3/30  brain]
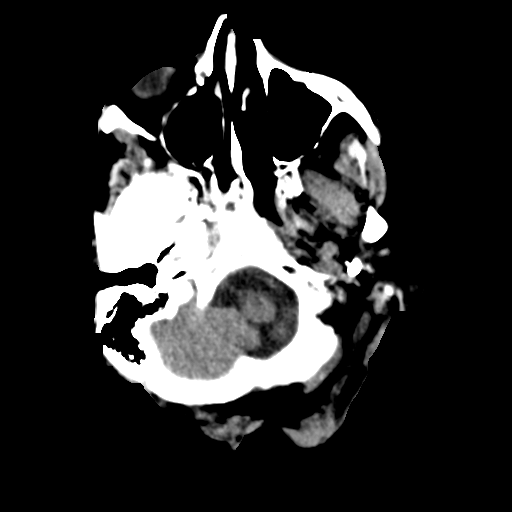
[im 3/30  bone]
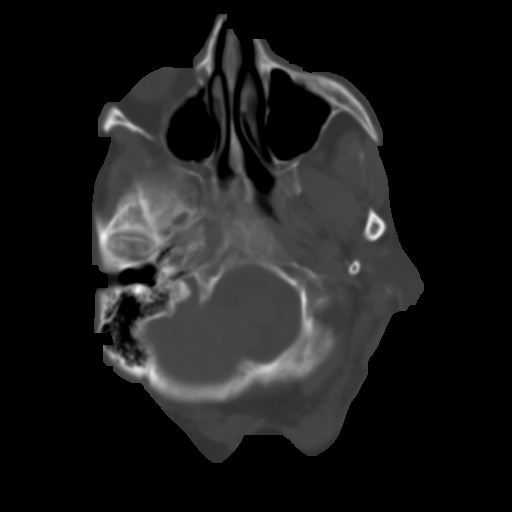
[im 6/30  brain]
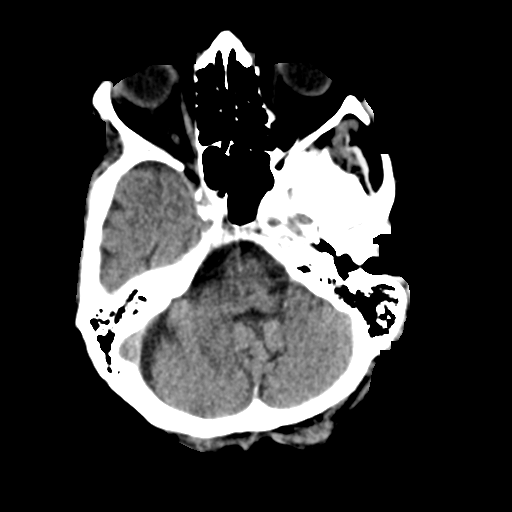
[im 9/30  brain]
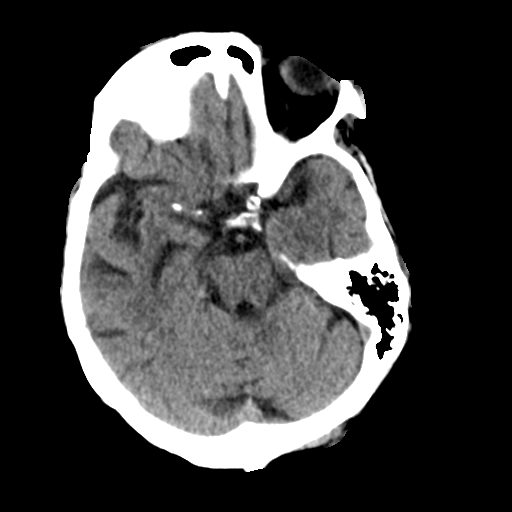
[im 12/30  brain]
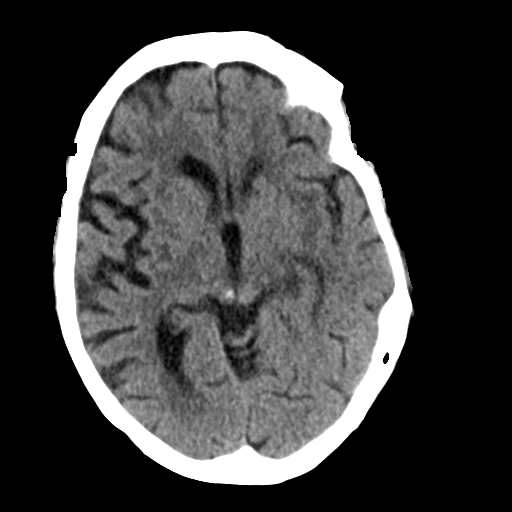
[im 16/30  brain]
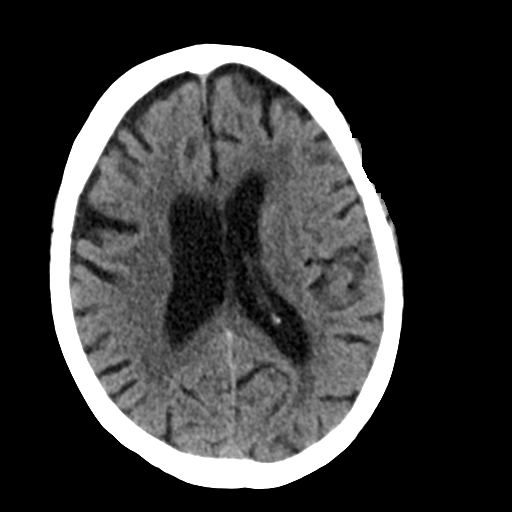
[im 16/30  bone]
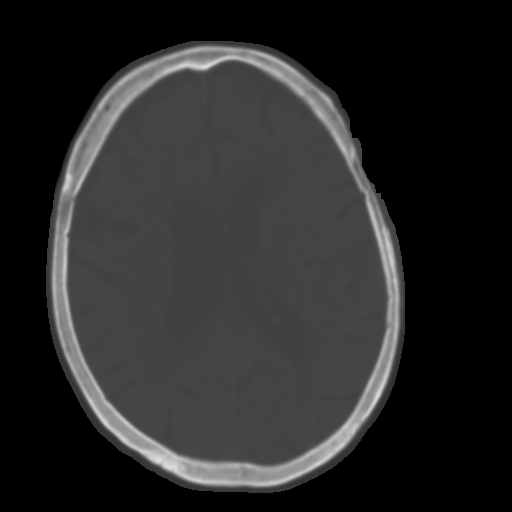
[im 19/30  brain]
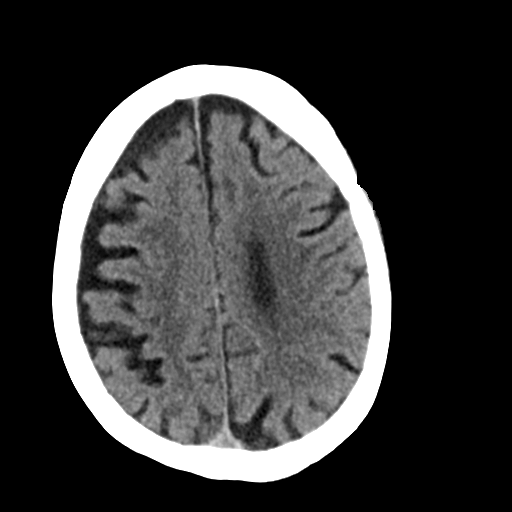
[im 22/30  brain]
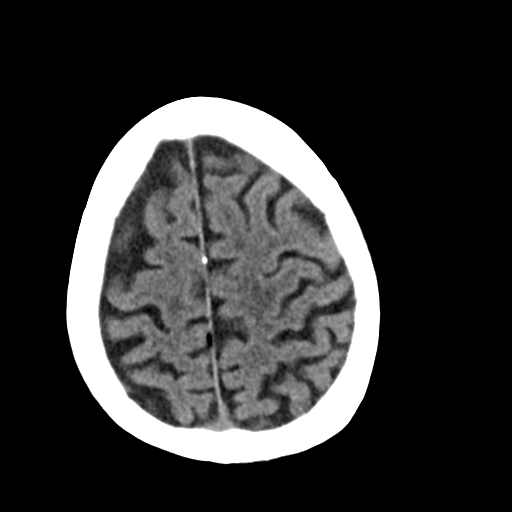
[im 25/30  brain]
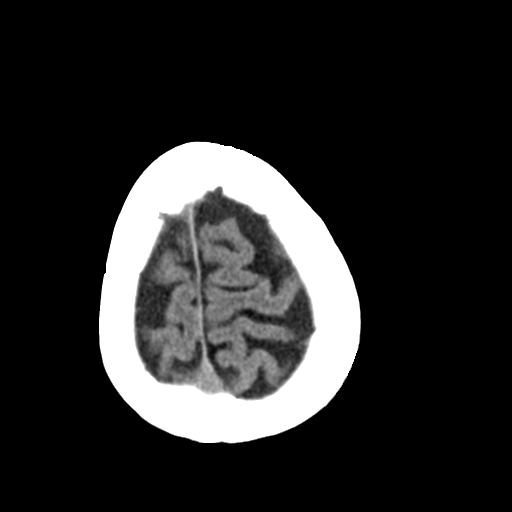
[im 28/30  brain]
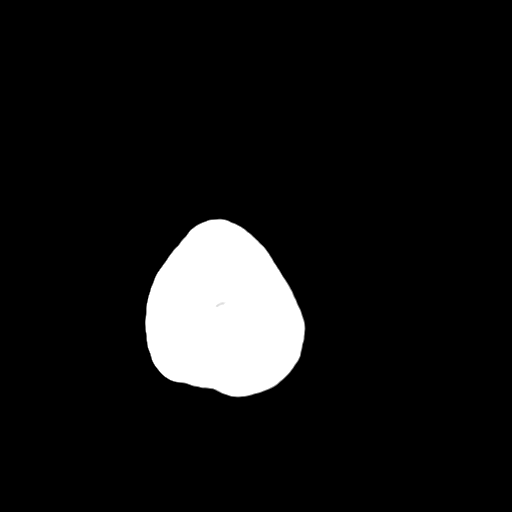
[im 28/30  bone]
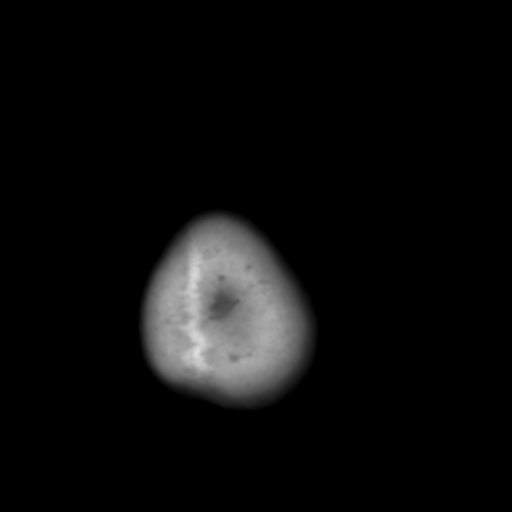

[Series 4: coronal soft tissue · coronal · 0.30mm/px · 3 of 66 slices shown]
[im 22/66  brain]
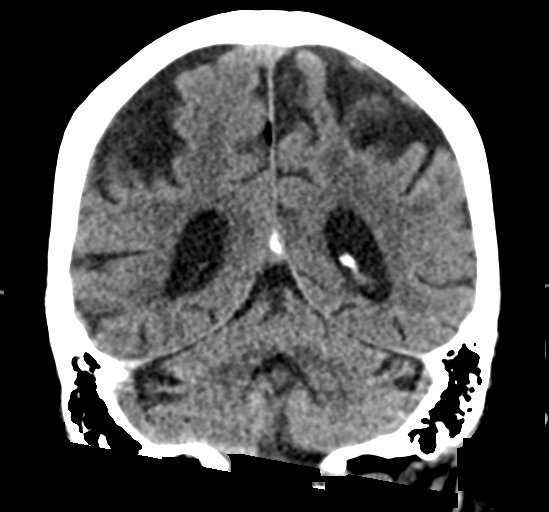
[im 29/66  brain]
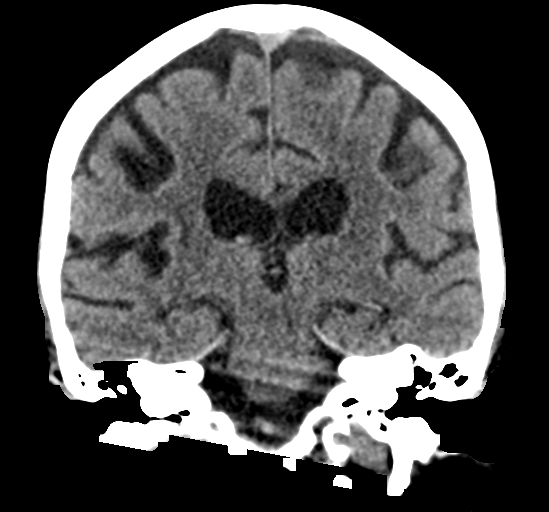
[im 37/66  brain]
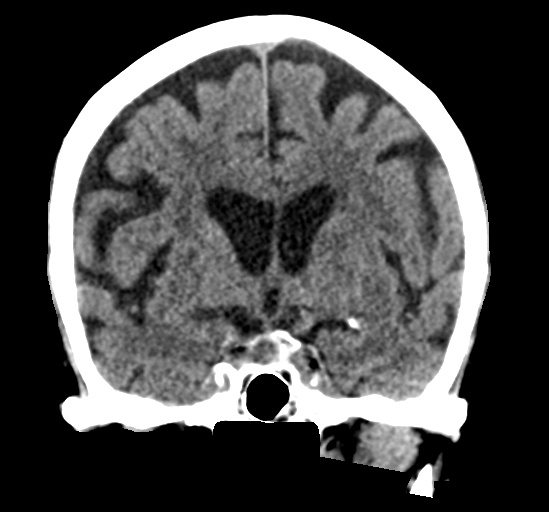

[Series 5: sagittal soft tissue · sagittal · 0.31mm/px · 3 of 54 slices shown]
[im 22/54  brain]
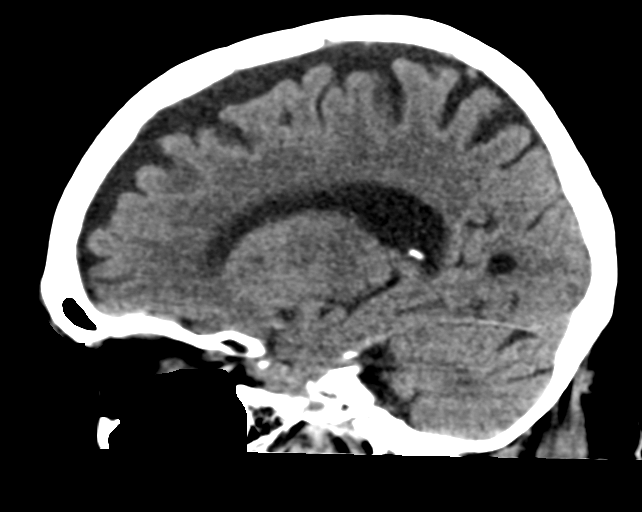
[im 27/54  brain]
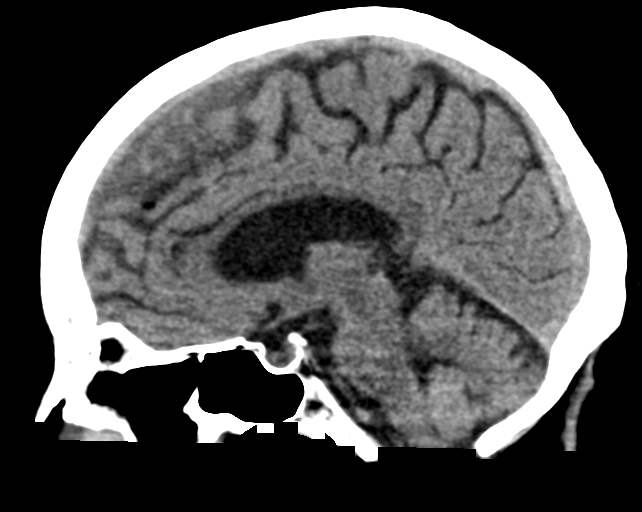
[im 33/54  brain]
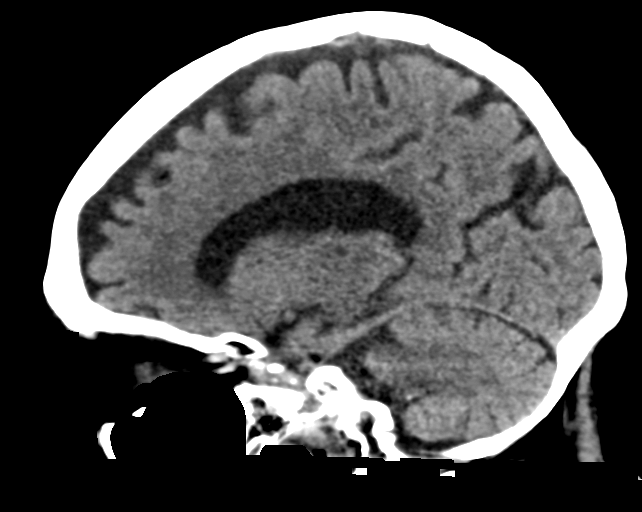

[15 of 47 positions shown; findings below may reference images not displayed]

FINDINGS: Brain: No evidence of acute infarction, hemorrhage, hydrocephalus,
extra-axial collection or mass lesion/mass effect. Nonspecific foci
of hypoattenuation in subcortical periventricular white matter are
compatible with moderate chronic microvascular ischemic changes.
Moderate diffuse brain parenchymal volume loss. Foci of
hypoattenuation within bilateral thalami, lentiform nuclei, and the
left caudate head are likely to represent chronic lacunar infarcts.

Vascular: Calcific atherosclerosis of the carotid siphons and
bilateral M1. No hyperdense vessel identified.

Skull: Normal. Negative for fracture or focal lesion.

Sinuses/Orbits: No acute finding.

Other: Bilateral intra-ocular lens replacement.
IMPRESSION: 1. No acute intracranial abnormality identified.
2. Moderate chronic microvascular ischemic changes and parenchymal
volume loss of the brain. Several chronic lacunar infarcts in the
basal ganglia.

By: Miyanui Esaeya M.D.

## 2019-01-25 IMAGING — US US CAROTID DUPLEX BILAT
1 series · 14 of 24 positions shown · non-contrast
Comparison: None.

CLINICAL DATA: Stroke

EXAM:
BILATERAL CAROTID DUPLEX ULTRASOUND
TECHNIQUE: Gray scale imaging, color Doppler and duplex ultrasound were
performed of bilateral carotid and vertebral arteries in the neck.

[Series 1: us carotid duplex bilat · 0.05mm/px · 14 of 68 slices shown]
[im 1/68]
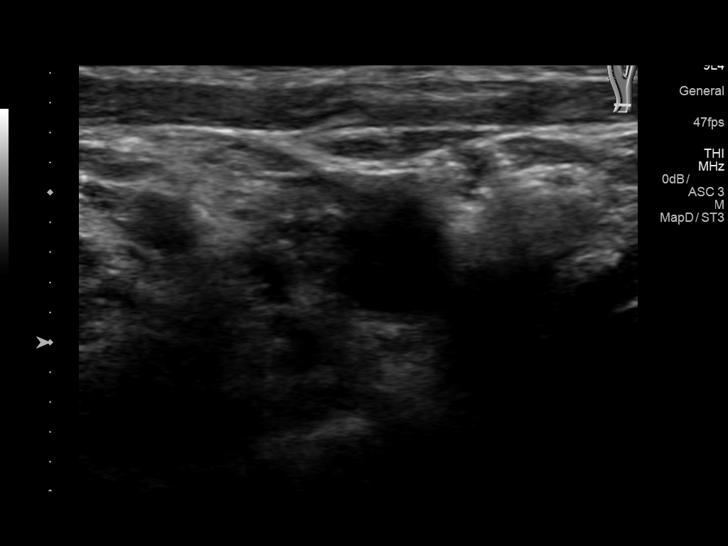
[im 6/68]
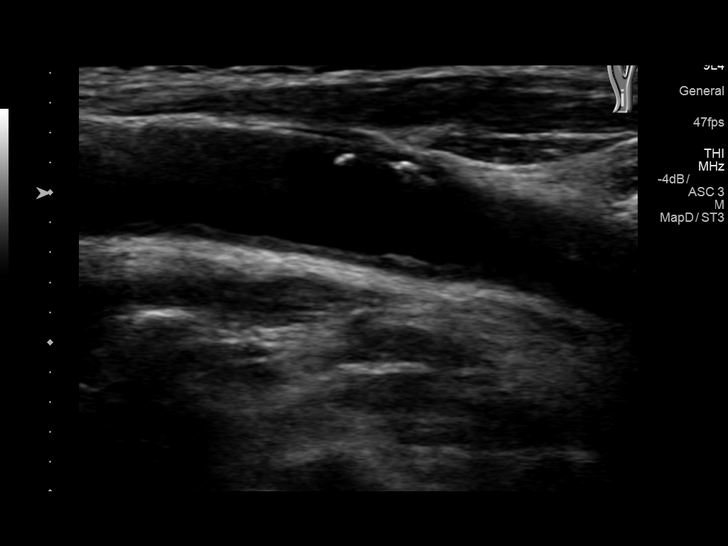
[im 12/68]
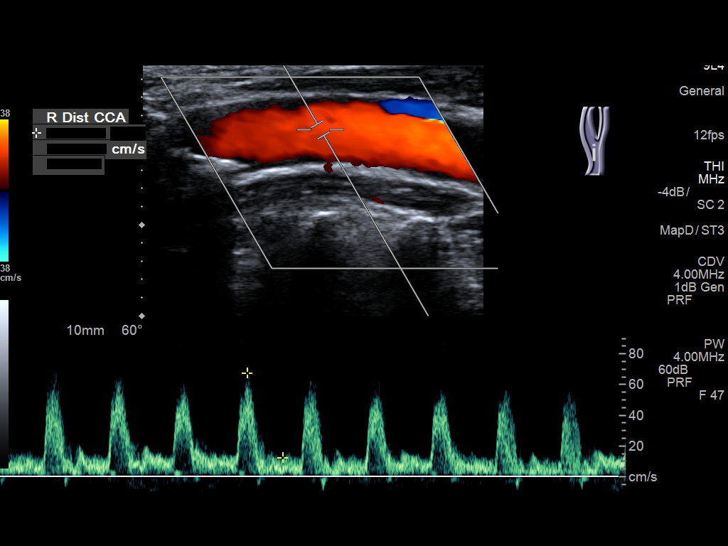
[im 18/68]
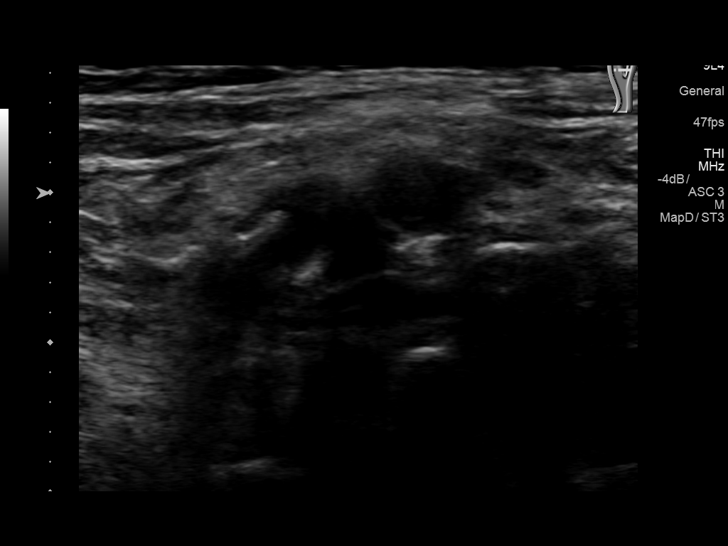
[im 21/68]
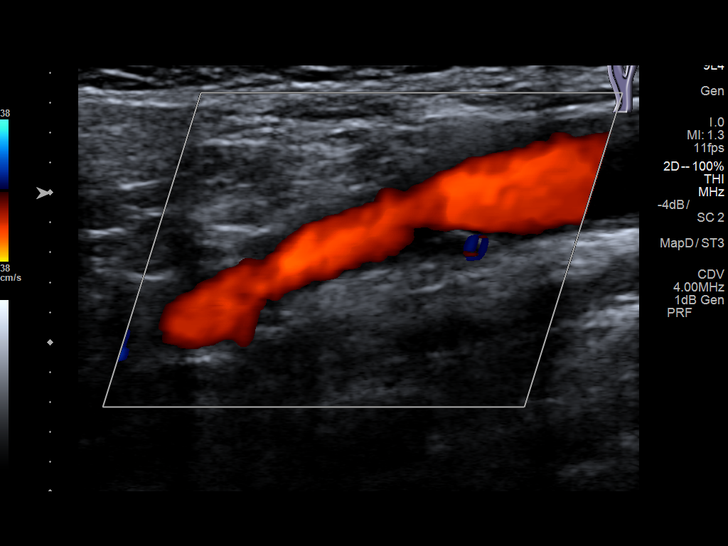
[im 27/68]
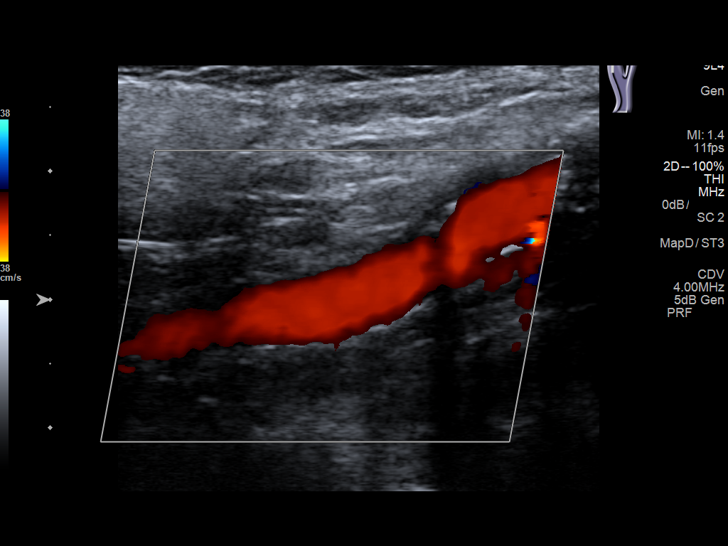
[im 33/68]
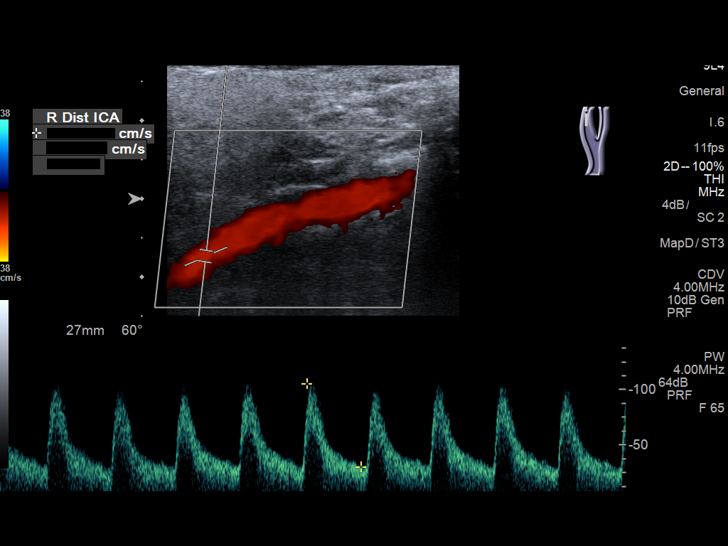
[im 35/68]
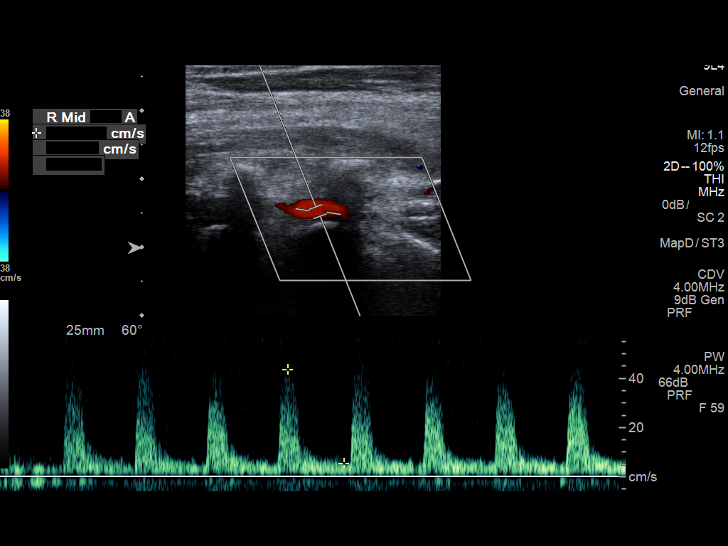
[im 41/68]
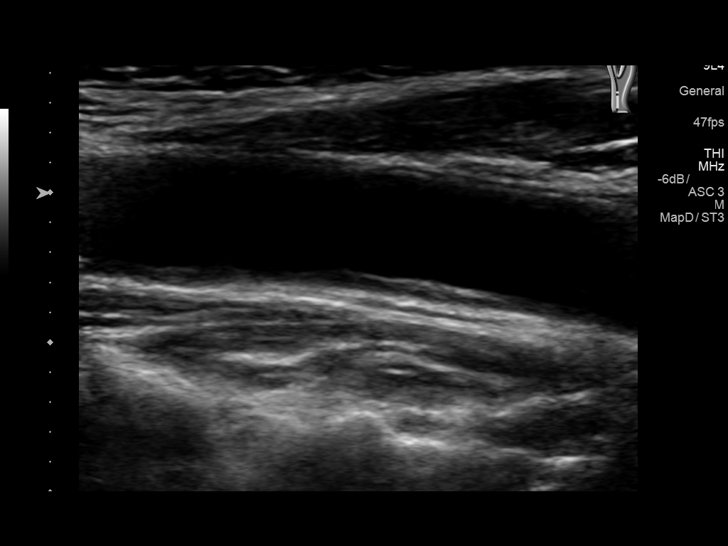
[im 47/68]
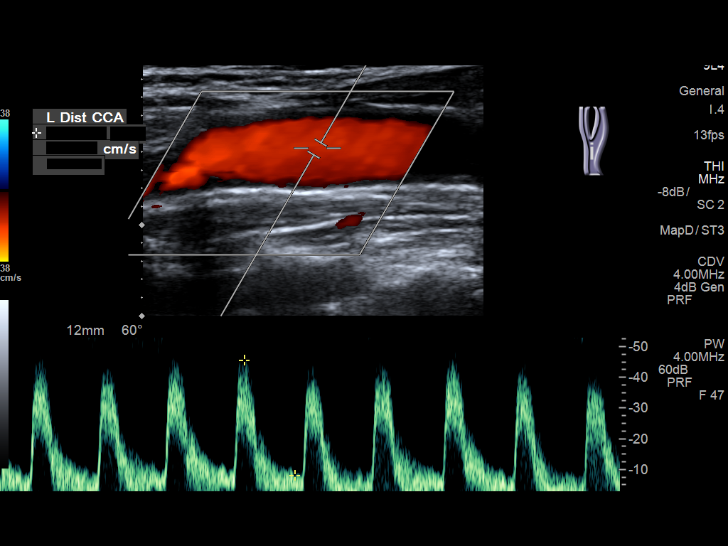
[im 53/68]
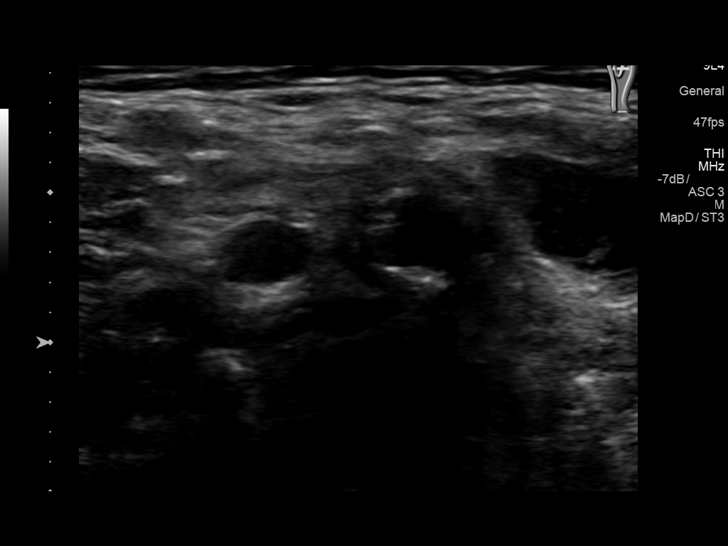
[im 56/68]
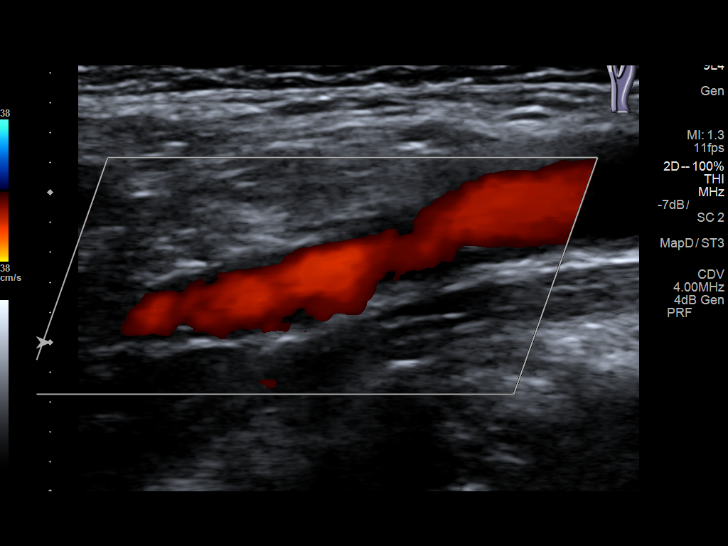
[im 62/68]
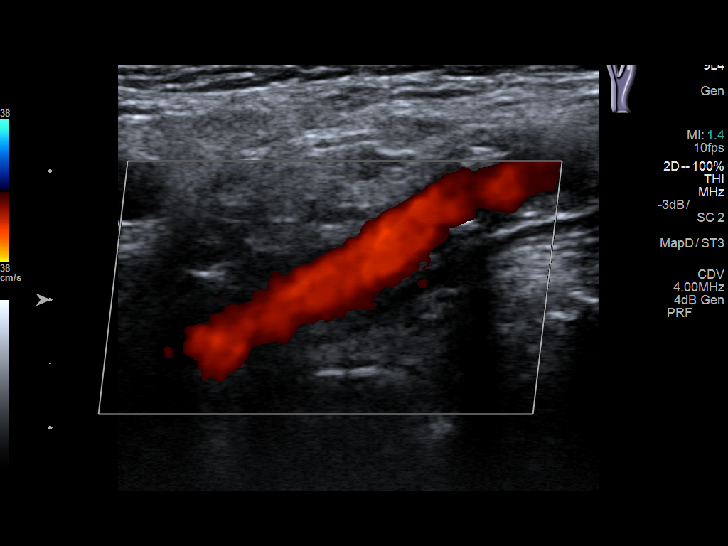
[im 68/68]
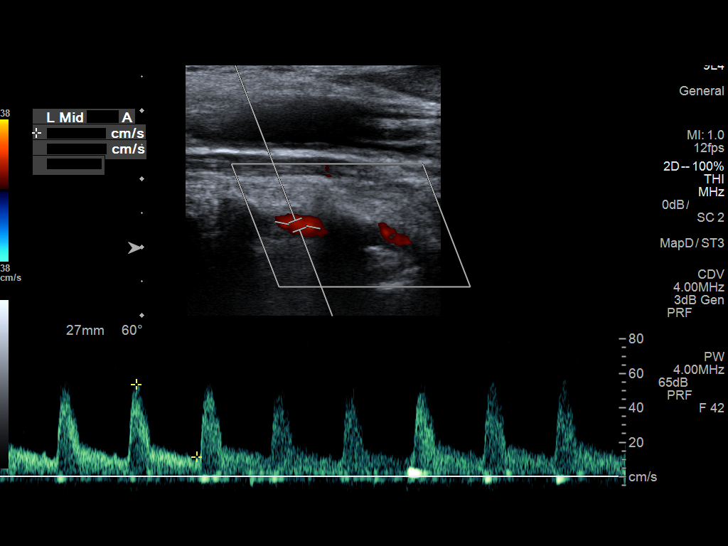

[14 of 24 positions shown; findings below may reference images not displayed]

FINDINGS: Criteria: Quantification of carotid stenosis is based on velocity
parameters that correlate the residual internal carotid diameter
with NASCET-based stenosis levels, using the diameter of the distal
internal carotid lumen as the denominator for stenosis measurement.

The following velocity measurements were obtained:

RIGHT

ICA:  89 cm/sec

CCA:  72 cm/sec

SYSTOLIC ICA/CCA RATIO:

DIASTOLIC ICA/CCA RATIO:

ECA:  81 cm/sec

LEFT

ICA:  85 cm/sec

CCA:  52 cm/sec

SYSTOLIC ICA/CCA RATIO:

DIASTOLIC ICA/CCA RATIO:

ECA:  83 cm/sec

RIGHT CAROTID ARTERY: Mild calcified plaque in the bulb. Low
resistance internal carotid Doppler pattern is preserved.

RIGHT VERTEBRAL ARTERY:  Antegrade.

LEFT CAROTID ARTERY: Mild calcified plaque in the bulb. Low
resistance internal carotid Doppler pattern is preserved.

LEFT VERTEBRAL ARTERY:  Antegrade.
IMPRESSION: Less than 50% stenosis in the right and left internal carotid
arteries.
